# Patient Record
Sex: Male | Born: 1955 | Race: White | Hispanic: No | Marital: Married | Smoking: Never smoker
Health system: Southern US, Community
[De-identification: ages and names within clinical notes are randomized; demographics above are authoritative.]

## PROBLEM LIST (undated history)

## (undated) DIAGNOSIS — Z87442 Personal history of urinary calculi: Secondary | ICD-10-CM

## (undated) DIAGNOSIS — R350 Frequency of micturition: Secondary | ICD-10-CM

## (undated) DIAGNOSIS — G8929 Other chronic pain: Secondary | ICD-10-CM

## (undated) DIAGNOSIS — N400 Enlarged prostate without lower urinary tract symptoms: Secondary | ICD-10-CM

## (undated) DIAGNOSIS — N201 Calculus of ureter: Secondary | ICD-10-CM

## (undated) DIAGNOSIS — F32A Depression, unspecified: Secondary | ICD-10-CM

## (undated) DIAGNOSIS — R1310 Dysphagia, unspecified: Secondary | ICD-10-CM

## (undated) DIAGNOSIS — R51 Headache: Secondary | ICD-10-CM

## (undated) DIAGNOSIS — M542 Cervicalgia: Secondary | ICD-10-CM

## (undated) DIAGNOSIS — R3915 Urgency of urination: Secondary | ICD-10-CM

## (undated) DIAGNOSIS — R519 Headache, unspecified: Secondary | ICD-10-CM

## (undated) DIAGNOSIS — M5030 Other cervical disc degeneration, unspecified cervical region: Secondary | ICD-10-CM

## (undated) DIAGNOSIS — K219 Gastro-esophageal reflux disease without esophagitis: Secondary | ICD-10-CM

## (undated) DIAGNOSIS — R011 Cardiac murmur, unspecified: Secondary | ICD-10-CM

## (undated) DIAGNOSIS — F419 Anxiety disorder, unspecified: Secondary | ICD-10-CM

## (undated) DIAGNOSIS — F329 Major depressive disorder, single episode, unspecified: Secondary | ICD-10-CM

## (undated) DIAGNOSIS — Q263 Partial anomalous pulmonary venous connection: Secondary | ICD-10-CM

## (undated) DIAGNOSIS — Z8774 Personal history of (corrected) congenital malformations of heart and circulatory system: Secondary | ICD-10-CM

## (undated) DIAGNOSIS — E785 Hyperlipidemia, unspecified: Secondary | ICD-10-CM

## (undated) DIAGNOSIS — I447 Left bundle-branch block, unspecified: Secondary | ICD-10-CM

## (undated) DIAGNOSIS — I10 Essential (primary) hypertension: Secondary | ICD-10-CM

## (undated) DIAGNOSIS — M79606 Pain in leg, unspecified: Secondary | ICD-10-CM

## (undated) DIAGNOSIS — G459 Transient cerebral ischemic attack, unspecified: Secondary | ICD-10-CM

## (undated) HISTORY — PX: ASD REPAIR: SHX258

## (undated) HISTORY — PX: BACK SURGERY: SHX140

## (undated) HISTORY — PX: CERVICAL FUSION: SHX112

## (undated) HISTORY — PX: EXTRACORPOREAL SHOCK WAVE LITHOTRIPSY: SHX1557

## (undated) HISTORY — PX: OTHER SURGICAL HISTORY: SHX169

## (undated) HISTORY — PX: CORONARY ARTERY BYPASS GRAFT: SHX141

## (undated) HISTORY — PX: COLONOSCOPY: SHX174

---

## 1999-09-15 ENCOUNTER — Ambulatory Visit (HOSPITAL_COMMUNITY): Admission: RE | Admit: 1999-09-15 | Discharge: 1999-09-15 | Payer: Self-pay | Admitting: Urology

## 1999-09-15 ENCOUNTER — Encounter: Payer: Self-pay | Admitting: Urology

## 1999-09-30 ENCOUNTER — Encounter: Admission: RE | Admit: 1999-09-30 | Discharge: 1999-09-30 | Payer: Self-pay | Admitting: Urology

## 1999-12-28 ENCOUNTER — Emergency Department (HOSPITAL_COMMUNITY): Admission: EM | Admit: 1999-12-28 | Discharge: 1999-12-28 | Payer: Self-pay | Admitting: Emergency Medicine

## 1999-12-28 ENCOUNTER — Encounter: Payer: Self-pay | Admitting: Emergency Medicine

## 2000-06-08 ENCOUNTER — Encounter: Admission: RE | Admit: 2000-06-08 | Discharge: 2000-06-08 | Payer: Self-pay | Admitting: Urology

## 2000-06-08 ENCOUNTER — Encounter: Payer: Self-pay | Admitting: Urology

## 2000-06-09 ENCOUNTER — Encounter: Payer: Self-pay | Admitting: Urology

## 2000-06-09 ENCOUNTER — Encounter: Admission: RE | Admit: 2000-06-09 | Discharge: 2000-06-09 | Payer: Self-pay | Admitting: Urology

## 2000-06-21 ENCOUNTER — Ambulatory Visit (HOSPITAL_COMMUNITY): Admission: RE | Admit: 2000-06-21 | Discharge: 2000-06-21 | Payer: Self-pay | Admitting: Urology

## 2000-06-21 HISTORY — PX: OTHER SURGICAL HISTORY: SHX169

## 2000-10-11 ENCOUNTER — Encounter: Payer: Self-pay | Admitting: Emergency Medicine

## 2000-10-11 ENCOUNTER — Inpatient Hospital Stay (HOSPITAL_COMMUNITY): Admission: EM | Admit: 2000-10-11 | Discharge: 2000-10-12 | Payer: Self-pay | Admitting: Emergency Medicine

## 2001-05-23 ENCOUNTER — Encounter: Admission: RE | Admit: 2001-05-23 | Discharge: 2001-05-23 | Payer: Self-pay | Admitting: Urology

## 2001-05-23 ENCOUNTER — Ambulatory Visit (HOSPITAL_COMMUNITY): Admission: RE | Admit: 2001-05-23 | Discharge: 2001-05-23 | Payer: Self-pay | Admitting: Urology

## 2001-05-23 ENCOUNTER — Encounter: Payer: Self-pay | Admitting: Urology

## 2001-06-09 ENCOUNTER — Encounter: Payer: Self-pay | Admitting: Urology

## 2001-06-10 ENCOUNTER — Encounter: Admission: RE | Admit: 2001-06-10 | Discharge: 2001-06-10 | Payer: Self-pay

## 2001-06-13 ENCOUNTER — Ambulatory Visit (HOSPITAL_COMMUNITY): Admission: RE | Admit: 2001-06-13 | Discharge: 2001-06-13 | Payer: Self-pay | Admitting: Urology

## 2001-06-13 ENCOUNTER — Encounter: Payer: Self-pay | Admitting: Urology

## 2002-01-27 ENCOUNTER — Encounter: Admission: RE | Admit: 2002-01-27 | Discharge: 2002-01-27 | Payer: Self-pay | Admitting: Neurosurgery

## 2002-01-27 ENCOUNTER — Encounter: Payer: Self-pay | Admitting: Neurosurgery

## 2002-02-01 ENCOUNTER — Encounter: Admission: RE | Admit: 2002-02-01 | Discharge: 2002-02-01 | Payer: Self-pay | Admitting: Neurosurgery

## 2002-02-01 ENCOUNTER — Encounter: Payer: Self-pay | Admitting: Neurosurgery

## 2002-05-08 ENCOUNTER — Encounter: Payer: Self-pay | Admitting: Urology

## 2002-05-08 ENCOUNTER — Encounter: Admission: RE | Admit: 2002-05-08 | Discharge: 2002-05-08 | Payer: Self-pay | Admitting: Urology

## 2002-05-09 ENCOUNTER — Encounter: Payer: Self-pay | Admitting: Urology

## 2002-05-09 ENCOUNTER — Encounter: Admission: RE | Admit: 2002-05-09 | Discharge: 2002-05-09 | Payer: Self-pay | Admitting: Urology

## 2002-05-16 ENCOUNTER — Encounter: Payer: Self-pay | Admitting: Urology

## 2002-05-16 ENCOUNTER — Encounter: Admission: RE | Admit: 2002-05-16 | Discharge: 2002-05-16 | Payer: Self-pay | Admitting: Urology

## 2002-05-18 ENCOUNTER — Ambulatory Visit (HOSPITAL_BASED_OUTPATIENT_CLINIC_OR_DEPARTMENT_OTHER): Admission: RE | Admit: 2002-05-18 | Discharge: 2002-05-18 | Payer: Self-pay | Admitting: Urology

## 2002-05-18 ENCOUNTER — Encounter: Payer: Self-pay | Admitting: Urology

## 2002-06-12 ENCOUNTER — Encounter: Payer: Self-pay | Admitting: Urology

## 2002-06-12 ENCOUNTER — Encounter: Admission: RE | Admit: 2002-06-12 | Discharge: 2002-06-12 | Payer: Self-pay | Admitting: Urology

## 2002-10-16 ENCOUNTER — Encounter: Admission: RE | Admit: 2002-10-16 | Discharge: 2002-10-16 | Payer: Self-pay | Admitting: Urology

## 2002-10-16 ENCOUNTER — Encounter: Payer: Self-pay | Admitting: Urology

## 2002-10-18 ENCOUNTER — Encounter: Payer: Self-pay | Admitting: Urology

## 2002-10-18 ENCOUNTER — Encounter: Admission: RE | Admit: 2002-10-18 | Discharge: 2002-10-18 | Payer: Self-pay | Admitting: Urology

## 2003-02-09 ENCOUNTER — Encounter: Admission: RE | Admit: 2003-02-09 | Discharge: 2003-02-09 | Payer: Self-pay | Admitting: Family Medicine

## 2003-02-09 ENCOUNTER — Encounter: Payer: Self-pay | Admitting: Family Medicine

## 2004-06-17 ENCOUNTER — Ambulatory Visit (HOSPITAL_COMMUNITY): Admission: RE | Admit: 2004-06-17 | Discharge: 2004-06-17 | Payer: Self-pay | Admitting: Chiropractic Medicine

## 2004-08-18 ENCOUNTER — Ambulatory Visit (HOSPITAL_COMMUNITY): Admission: RE | Admit: 2004-08-18 | Discharge: 2004-08-18 | Payer: Self-pay | Admitting: Urology

## 2006-01-15 ENCOUNTER — Ambulatory Visit (HOSPITAL_BASED_OUTPATIENT_CLINIC_OR_DEPARTMENT_OTHER): Admission: RE | Admit: 2006-01-15 | Discharge: 2006-01-15 | Payer: Self-pay | Admitting: Urology

## 2006-01-15 HISTORY — PX: OTHER SURGICAL HISTORY: SHX169

## 2006-01-16 ENCOUNTER — Emergency Department (HOSPITAL_COMMUNITY): Admission: EM | Admit: 2006-01-16 | Discharge: 2006-01-16 | Payer: Self-pay | Admitting: Emergency Medicine

## 2006-01-18 ENCOUNTER — Ambulatory Visit (HOSPITAL_COMMUNITY): Admission: RE | Admit: 2006-01-18 | Discharge: 2006-01-18 | Payer: Self-pay | Admitting: Urology

## 2007-05-23 ENCOUNTER — Ambulatory Visit (HOSPITAL_COMMUNITY): Admission: RE | Admit: 2007-05-23 | Discharge: 2007-05-23 | Payer: Self-pay | Admitting: Urology

## 2009-10-10 ENCOUNTER — Ambulatory Visit (HOSPITAL_COMMUNITY): Admission: RE | Admit: 2009-10-10 | Discharge: 2009-10-10 | Payer: Self-pay | Admitting: Urology

## 2011-05-08 NOTE — Consult Note (Signed)
Scott Burch, Scott Burch                ACCOUNT NO.:  [id number]   MEDICAL RECORD NO.:  [id number]          PATIENT TYPE:  EMS   LOCATION:  ED                           FACILITY:  [facility]   PHYSICIAN:  Courtney Paris, M.D.DATE OF BIRTH:  [birth year]   DATE OF CONSULTATION:  01/16/2006  DATE OF DISCHARGE:                                   CONSULTATION   This 55 year old patient of Dr. Imelda Pillow came to the emergency room with  acute left flank pain that had become more chronic.  He had a stent placed  by Dr. Patsi Sears for a proximal UPJ stone on the left two days ago and  apparently has not been able to get comfortable since then.  He has had  stents before, but this was just causing excruciating pain and not being  relieved by his oxycodone that he had at home.   His vital signs are stable.  He is a middle-aged male in no acute distress  but seems to be pacing.  He did get comfortable with 100 mg of Demerol and  25 of Phenergan IM after about 30 minutes. Abd- mild CVAT left sid to fist  percussion.   KUB shows that the stent was in good position on the left side with about a  5-6 mm stone at the UP junction.  Bony structures and soft tissue shadows  look normal.  After going over this with him, spent 16 minutes with the  patient and his wife.  Gave him another prescription for some oxycodone  without acetaminophen because of his liver disease, #30 5 mg tablets to take  1-2 every 3-4 hours p.r.n. pain, a prescription for some Phenergan  suppositories 25 mg to take for nausea, no more than four per day.  He has  some Flomax and some Pyridium Plus to take at home.  He will limit his  activity and try to make it until January 29 when he has lithotripsy  scheduled.  He did not receive Toradol as originally ordered by Dr.  Patsi Sears, because that would have delayed his lithotripsy.      Courtney Paris, M.D.  Electronically Signed     HMK/MEDQ  D:  01/16/2006  T:   01/17/2006  Job:  [id number]

## 2011-05-08 NOTE — Op Note (Signed)
NAMELUCILLE, Scott Burch                ACCOUNT NO.:  [id number]   MEDICAL RECORD NO.:  [id number]          PATIENT TYPE:  AMB   LOCATION:  [facility]                         FACILITY:  [facility]   PHYSICIAN:  Sigmund I. Patsi Sears, M.D.DATE OF BIRTH:  1956-01-02   DATE OF PROCEDURE:  01/15/2006  DATE OF DISCHARGE:                                 OPERATIVE REPORT   PREOPERATIVE DIAGNOSIS:  Left upper ureteral calculus.   POSTOPERATIVE DIAGNOSIS:  Left upper ureteral calculus.   OPERATIONS:  Cystourethroscopy, left retrograde pyelogram, left  ureteroscopy, left double-J catheter (5-French x 26 cm [employer]).   SURGEON:  Sigmund I. Patsi Sears, M.D.   ANESTHESIA:  General LMA.   PREPARATION:  After appropriate preanesthesia, the patient is brought to the  operating room, placed on the operating table in dorsal supine position  where general LMA anesthesia was introduced. He was then replaced in dorsal  lithotomy position where the pubis was prepped with Betadine solution and  draped in the usual fashion.   REVIEW OF HISTORY:  This 55 year old male presented with acute and recurrent  renal colic, with left-sided pain, nausea, vomiting, with CT showing a 6 mm  in the left upper ureter. The stone has continued to cause colic and has not  progressed by KUB. Therefore, he will be for cystoscopy, double-J catheter  and possible stone manipulation..   DESCRIPTION OF PROCEDURE:  Cystourethroscopy was accomplished and showed a  normal-appearing bladder. Left retrograde pyelogram was performed and showed  upper ureteral dilation, secondary to a stone just outside the  ureterovesical junction. He appears to have and intrarenal pelvis.   The long 6.4 ureteroscope was passed into the upper ureter and into the  renal pelvis, but I could not identify the stone. It is presumed that the  stone was pushed into the kidney with x-ray contrast fluid.   It was elected to leave a double-J catheter, and a  5-French x 26 cm double-J  catheter was placed without difficulty coiled in the renal pelvis, and in  the bladder. The patient tolerated the procedure well. Xylocaine jelly was  placed in the ureter. Because of the patient's IODINE allergy, Hibiclens was  used to prep with. The patient was then given IV Toradol, awakened and taken  to the recovery room in good condition.      Sigmund I. Patsi Sears, M.D.  Electronically Signed     SIT/MEDQ  D:  01/15/2006  T:  01/16/2006  Job:  [id number]

## 2011-05-08 NOTE — Discharge Summary (Signed)
[facility]  Patient:    Scott Burch, Scott Burch                       MRN: [id number] Adm. Date:  [id number] Disc. Date: [id number] Attending:  Colon Branch CC:         Noralyn Pick. Eden Emms, M.D. [facility]  Dyanne Carrel, M.D.  Thomas A. Patty Sermons, M.D.   Discharge Summary  DATE OF BIRTH:  [birth year].  ADMISSION DIAGNOSES: 1. Chest pain, rule out myocardial infarction. 2. 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 3. Asthmatic bronchitis.  DISCHARGE DIAGNOSES: 1. Postpericardotomy syndrome. 2. Chest pain, resolved - myocardial infarction ruled out by enzymes. 3. Asthmatic bronchitis.  HISTORY OF PRESENT ILLNESS:  Please refer to Admission History and Physical for this patients initial presentation.  Briefly, he is a 55 year old gentleman who was admitted by [facility] with a history of chest pain and pressure.  He had had two weeks of intermittent symptoms, and the chest pain was radiating to his left arm.  He described the discomfort as tightness, and it was nonexertional and nonpositional.  There may have been a pleuritic component to it.  He had three episodes in the past two weeks.  On the morning of admission the episode was prolonged and led to his evaluation in the [facility] Emergency Department.  His EKG demonstrated no acute ischemic changes.  It was felt that his chest discomfort was unlikely to represent myocardial ischemia.  A ventilation-perfusion scan was ordered.  Pulmonary consultation was requested.  Upon my evaluation he was noted to be improved after nebulized bronchodilators.  He still was markedly wheezing on exam.  The chest pain had relented somewhat, but not completely resolved.  HOSPITAL COURSE:  His hospitalization was brief.  He was admitted to telemetry.  There were no arrhythmias noted.  Myocardial infarction was ruled out by enzymes.   The ventilation-perfusion scan revealed multiple matched perfusion and ventilation defects.  This was felt to be low probability for pulmonary embolism, and given my assessment that his pretest probability was extremely low, no further evaluation was warranted.  By the day after admission he was markedly improved and to be discharged.  DISCHARGE MEDICATIONS: 1. Celexa 20 mg p.o. q.d. 2. Androgel 50 mcg one patch q.d. 3. Colchicine 0.6 mg p.o. q.d. 4. Prednisone 40 mg p.o. q.d. x 5 days. 5. Advair 250/50 one puff q.12h. 6. Doxycycline 100 mg p.o. q.12h. x 7 days. 7. Albuterol two sprays q.4h. p.r.n. 8. Tussionex 5 cc p.o. q.12h. p.r.n. cough.  FOLLOWUP INSTRUCTIONS:  He is to call Dr. Sung Amabile office to arrange for a follow-up appointment in three to four weeks.  DISCHARGE INSTRUCTIONS:  He is to remain out of work for the remainder of this week, but can return to work on October 18, 2000, if he feels 90% improved or better.  ADDENDUM:  The patient was initially evaluated by [facility]; however, it was revealed that he has been seen by Dr. Patty Sermons in the past. Therefore, cardiology follow-up will be arranged with him. DD:  10/12/00 TD:  10/12/00 Job: [id number]

## 2011-05-08 NOTE — Op Note (Signed)
[facility]  Patient:    Scott Burch, Scott Burch                       MRN: [id number] Proc. Date: 06/21/00 Adm. Date:  [id number] Attending:  Laqueta Jean CC:         Sigmund I. Patsi Sears, M.D.                           Operative Report  PREOPERATIVE DIAGNOSIS:  Left ureteral stones.  POSTOPERATIVE DIAGNOSES:  Left ureteral stones, left ureteral stricture.  PROCEDURE:  Cystourethroscopy, left retrograde pyelogram, dilation of left lower ureteral stricture with left double J catheter, ureteroscopy.  SURGEON:  Sigmund I. Patsi Sears, M.D.  ANESTHESIA:  General endotracheal.  PREPARATION:  After appropriate preanesthesia, the patient was brought to the operating room and placed on the operating table in the dorsal supine position, where general endotracheal anesthesia was induced.  The patient was then replaced in the dorsal lithotomy position and the pubis was prepped with Betadine solution in the usual fashion.  DESCRIPTION OF PROCEDURE:  Cystourethroscopy was accomplished, which showed a normal-appearing bladder.  A left retrograde pyelogram was performed, which was felt to possibly show left ureteral calculi but definitely left renal pelvic stones, identified by CT scan.  These appeared to be quite small. Direct vision ureteroscopy revealed a dense left lower ureteral stricture just proximal to the ureterovesical junction, and this was dilated by passing a guidewire past the stricture and then dilating the stricture.  The ureteroscope then passed over a basket, which was passed through the scope into the upper ureter, further dilating the stricture.  Ureteroscopy did not reveal any stone in the ureter, and the ureter was ureteroscoped up to the level of the renal pelvis.  The basket was passed into the renal pelvis but could not capture any of these stones.  The ureteroscope was also withdrawn with the basket open in case any stones could be  identified on the way out, but no stones were identified.  It was felt that the patients problem primarily was secondary to the stricture disease of his ureter.  The double J catheter was passed into the renal pelvis and coiled in the bladder, and the patient was given IV Toradol, awakened, and taken to the recovery room in good condition. DD:  06/21/00 TD:  06/21/00 Job: [id number]

## 2012-05-31 ENCOUNTER — Encounter (HOSPITAL_COMMUNITY): Payer: Self-pay | Admitting: *Deleted

## 2012-05-31 ENCOUNTER — Observation Stay (HOSPITAL_COMMUNITY)
Admission: EM | Admit: 2012-05-31 | Discharge: 2012-06-01 | Disposition: A | Discharge status: Home / Self Care | Payer: 59 | Attending: Infectious Disease | Admitting: Infectious Disease

## 2012-05-31 ENCOUNTER — Emergency Department (HOSPITAL_COMMUNITY): Payer: 59

## 2012-05-31 DIAGNOSIS — E785 Hyperlipidemia, unspecified: Secondary | ICD-10-CM | POA: Insufficient documentation

## 2012-05-31 DIAGNOSIS — R42 Dizziness and giddiness: Secondary | ICD-10-CM | POA: Insufficient documentation

## 2012-05-31 DIAGNOSIS — R51 Headache: Principal | ICD-10-CM | POA: Insufficient documentation

## 2012-05-31 DIAGNOSIS — I10 Essential (primary) hypertension: Secondary | ICD-10-CM | POA: Insufficient documentation

## 2012-05-31 DIAGNOSIS — I639 Cerebral infarction, unspecified: Secondary | ICD-10-CM

## 2012-05-31 DIAGNOSIS — R5381 Other malaise: Secondary | ICD-10-CM | POA: Insufficient documentation

## 2012-05-31 HISTORY — DX: Hyperlipidemia, unspecified: E78.5

## 2012-05-31 HISTORY — DX: Essential (primary) hypertension: I10

## 2012-05-31 LAB — CSF CELL COUNT WITH DIFFERENTIAL
Eosinophils, CSF: NONE SEEN % (ref 0–1)
RBC Count, CSF: 112 /mm3 — ABNORMAL HIGH
RBC Count, CSF: 9 /mm3 — ABNORMAL HIGH
Segmented Neutrophils-CSF: NONE SEEN % (ref 0–6)
Tube #: 1
Tube #: 4
WBC, CSF: 1 /mm3 (ref 0–5)

## 2012-05-31 LAB — DIFFERENTIAL
Basophils Relative: 0 % (ref 0–1)
Eosinophils Absolute: 0.3 10*3/uL (ref 0.0–0.7)
Monocytes Absolute: 0.5 10*3/uL (ref 0.1–1.0)
Neutro Abs: 4.8 10*3/uL (ref 1.7–7.7)

## 2012-05-31 LAB — LIPID PANEL
LDL Cholesterol: 102 mg/dL — ABNORMAL HIGH (ref 0–99)
Total CHOL/HDL Ratio: 3.6 RATIO
VLDL: 25 mg/dL (ref 0–40)

## 2012-05-31 LAB — CBC
HCT: 41.6 % (ref 39.0–52.0)
MCV: 85.4 fL (ref 78.0–100.0)
Platelets: 197 10*3/uL (ref 150–400)
RBC: 4.87 MIL/uL (ref 4.22–5.81)
WBC: 7.4 10*3/uL (ref 4.0–10.5)

## 2012-05-31 LAB — CARDIAC PANEL(CRET KIN+CKTOT+MB+TROPI): Total CK: 213 U/L (ref 7–232)

## 2012-05-31 LAB — COMPREHENSIVE METABOLIC PANEL
ALT: 41 U/L (ref 0–53)
AST: 39 U/L — ABNORMAL HIGH (ref 0–37)
Albumin: 4.1 g/dL (ref 3.5–5.2)
CO2: 28 mEq/L (ref 19–32)
Calcium: 9.3 mg/dL (ref 8.4–10.5)
GFR calc non Af Amer: >90 mL/min (ref >90)
Glucose, Bld: 93 mg/dL (ref 70–99)
Sodium: 141 mEq/L (ref 135–145)
Total Protein: 7.3 g/dL (ref 6.0–8.3)

## 2012-05-31 LAB — GRAM STAIN

## 2012-05-31 LAB — PROTEIN AND GLUCOSE, CSF: Total  Protein, CSF: 25 mg/dL (ref 15–45)

## 2012-05-31 MED ORDER — MORPHINE SULFATE 4 MG/ML IJ SOLN: 4.0000 mg | INTRAMUSCULAR | Status: DC | PRN

## 2012-05-31 MED ORDER — ACETAMINOPHEN 325 MG PO TABS: 650.0000 mg | ORAL_TABLET | Freq: Four times a day (QID) | ORAL | Status: DC | PRN

## 2012-05-31 MED ORDER — ALUM & MAG HYDROXIDE-SIMETH 200-200-20 MG/5ML PO SUSP: 30.0000 mL | Freq: Four times a day (QID) | ORAL | Status: DC | PRN

## 2012-05-31 MED ORDER — ONDANSETRON HCL 4 MG/2ML IJ SOLN: 4.0000 mg | Freq: Four times a day (QID) | INTRAMUSCULAR | Status: DC | PRN

## 2012-05-31 MED ORDER — CITALOPRAM HYDROBROMIDE 40 MG PO TABS
40.0000 mg | ORAL_TABLET | Freq: Every day | ORAL | Status: DC
  Administered 2012-06-01: 40 mg via ORAL
  Filled 2012-05-31: qty 1

## 2012-05-31 MED ORDER — ASPIRIN 325 MG PO TABS
325.0000 mg | ORAL_TABLET | Freq: Every day | ORAL | Status: DC
  Administered 2012-05-31: 325 mg via ORAL
  Filled 2012-05-31 (×2): qty 1

## 2012-05-31 MED ORDER — ACETAMINOPHEN 650 MG RE SUPP: 650.0000 mg | Freq: Four times a day (QID) | RECTAL | Status: DC | PRN

## 2012-05-31 MED ORDER — SIMVASTATIN 20 MG PO TABS
20.0000 mg | ORAL_TABLET | Freq: Every day | ORAL | Status: DC
  Filled 2012-05-31: qty 1

## 2012-05-31 MED ORDER — LORAZEPAM 2 MG/ML IJ SOLN
1.0000 mg | Freq: Once | INTRAMUSCULAR | Status: AC
  Administered 2012-05-31: 1 mg via INTRAVENOUS
  Filled 2012-05-31: qty 1

## 2012-05-31 MED ORDER — SODIUM CHLORIDE 0.9 % IV SOLN
INTRAVENOUS | Status: AC
  Administered 2012-05-31: 23:00:00 via INTRAVENOUS

## 2012-05-31 MED ORDER — ONDANSETRON HCL 4 MG PO TABS: 4.0000 mg | ORAL_TABLET | Freq: Four times a day (QID) | ORAL | Status: DC | PRN

## 2012-05-31 NOTE — H&P (Signed)
Hospital Admission Note Date: 05/31/2012  Patient name: Scott Burch Medical record number: [id number] Date of birth: [birth year] Age: 56 y.o. Gender: male PCP: No primary provider on file. Doctor at [employer]  Medical Service: B 1 [facility]  Attending physician:   Dr. Daiva Eves  1st Contact:  Dr. Dorise Hiss  Pager: [phone] 2nd Contact:  Dr. Dorthula Rue  Pager: [phone] After 5 pm or weekends: 1st Contact:      Pager: [phone] 2nd Contact:      Pager: [phone]  Chief Complaint:  Headache, sleepiness  History of Present Illness: The patient is a 56 YO man who presents to the ED with acute onset of headache 3 days prior to admission that was the "worst headache of his life" which lasted for 45 minutes. He was drousy during this time and had some tingling in his left hand. He also had change in his gait with some stumbling. His wife feels he has limited memory of that evening. He went to bed that evening and the following day he was also drowsy the whole day with intermittent sleeping. He also had decreased appetite. On Monday, he tried to go to work and was unable to stay awake so went to [facility] ER with his wife. However the wait was extensive and they ended up leaving without being seen. They called his PCP the following day and were told to get evaluated and came to [facility] ED. They get all of their care at [facility] because he had a congenital heart surgery and they feel they will have their records. They have a PCP there. His only past medical history is significant for hyperlipidemia and hypertension. No fevers at home.   Meds: Current Outpatient Rx  Name Route Sig Dispense Refill  . ASPIRIN 81 MG PO CHEW Oral Chew 81-162 mg by mouth at bedtime.    Marland Kitchen CITALOPRAM HYDROBROMIDE 40 MG PO TABS Oral Take 40 mg by mouth daily.    Marland Kitchen LISINOPRIL 20 MG PO TABS Oral Take 20 mg by mouth daily.    Marland Kitchen OVER THE COUNTER MEDICATION Oral Take 1 tablet by mouth daily as needed. urocit For kidney stones    . SIMVASTATIN 20 MG PO  TABS Oral Take 20 mg by mouth daily.      Allergies: Allergies as of 05/31/2012 - Review Complete 05/31/2012  Allergen Reaction Noted  . Iodine Anaphylaxis and Hives 05/31/2012   Past Medical History  Diagnosis Date  . Heart disease, unspecified   . Hypertension   . Hyperlipidemia    Past Surgical History  Procedure Date  . Cervical fusion   . Kidney stone surgery   . Cardiac surgery    No family history on file. History   Social History  . Marital Status: Married    Spouse Name: N/A    Number of Children: N/A  . Years of Education: N/A   Occupational History  . Not on file.   Social History Main Topics  . Smoking status: Never Smoker   . Smokeless tobacco: Not on file  . Alcohol Use: Yes  . Drug Use: No  . Sexually Active:    Other Topics Concern  . Not on file   Social History Narrative  . No narrative on file    Review of Systems: Pertinent items are noted in HPI.  Physical Exam: Blood pressure 139/73, pulse 61, temperature 98.4 F (36.9 C), temperature source Oral, resp. rate 18, height 5\' 9"  (1.753 m), weight 192 lb (87.091 kg),  SpO2 98.00%.   Lab results: Basic Metabolic Panel:  Basename 05/31/12 1423  NA 141  K 4.4  CL 104  CO2 28  GLUCOSE 93  BUN 15  CREATININE 0.89  CALCIUM 9.3  MG --  PHOS --   Liver Function Tests:  Basename 05/31/12 1423  AST 39*  ALT 41  ALKPHOS 80  BILITOT 0.3  PROT 7.3  ALBUMIN 4.1   CBC:  Basename 05/31/12 1423  WBC 7.4  NEUTROABS 4.8  HGB 13.7  HCT 41.6  MCV 85.4  PLT 197   Imaging results:  Dg Chest 2 View  05/31/2012  *RADIOLOGY REPORT*  Clinical Data: Weakness.  Headache and numbness.  CHEST - 2 VIEW  Comparison: 05/31/2012  Findings: There is mild cardiac enlargement.  There is no pleural effusion or edema.  There is no airspace consolidation identified.  Prior median sternotomy and CABG procedure.  IMPRESSION:  1.  No acute cardiopulmonary abnormalities.  Original Report Authenticated By:  Rosealee Albee, M.D.   Ct Head Wo Contrast  05/31/2012  *RADIOLOGY REPORT*  Clinical Data: Dizziness CT, and weakness.  CT HEAD WITHOUT CONTRAST  Technique:  Contiguous axial images were obtained from the base of the skull through the vertex without contrast.  Comparison: None  Findings: No acute intracranial abnormality is identified. Specifically, there is no hemorrhage, hydrocephalus, mass effect, mass lesion, or evidence of acute infarction.  There is some mucosal thickening of the ethmoid air cells.  There are no air-fluid levels in the sinuses.  The skull is intact.  The soft tissues of the scalp are symmetric.  IMPRESSION:  1.  No acute intracranial abnormality. 2.  Chronic ethmoid sinus disease.  Original Report Authenticated By: Britta Mccreedy, M.D.    Other results: EKG: unchanged from previous tracings, widened QRS unchanged from 2010, no signs of ischemia or t wave changes.  Assessment & Plan by Problem: TIA/Stroke Headache- Will likely need MRI head and will order FLP, HgA1C, HIV, UDS, CE. To rule out intracranial bleed will await results of LP for cell count and gram stain. If negative for bleed will likely also need carotid dopplers and 2 D echo. Other differential could include migraine.   Hypertension - Will hold lisinopril as he may have stroke and allow permissive hypertension.   Hyperlipidemia - Pt will be continued on statin and based on FLP may need dosage adjustment.   History of congenital heart surgery - No details about this however was done at [facility] years ago.   DVT ppx - none due to concern for head bleed  Signed: Genella Mech 05/31/2012, 6:13 PM

## 2012-05-31 NOTE — ED Notes (Signed)
Patient reports onset of worst headache ever on Saturday.  He slept for 14 hours.  He has noticed since he has unsteady gait, dizziness, sleepiness, and feels weak on his left side.  He continues to have a mild headache

## 2012-05-31 NOTE — ED Notes (Signed)
Patient transported to CT 

## 2012-05-31 NOTE — CV Procedure (Signed)
Lumbar punctureLumbar Puncture Procedure Note  Pre-operative Diagnosis: Headache  Post-operative Diagnosis:  headache  Indications: Diagnostic  Procedure Details   Consent: Informed consent was obtained. Risks of the procedure were discussed including: infection, bleeding, pain and headache.  The patient was positioned under sterile conditions. Betadine solution and sterile drapes were utilized. A spinal needle was inserted at the L3/4 interspace.  Spinal fluid was obtained and sent to the laboratory.  Findings 10 mL of clear spinal fluid was obtained. Opening Pressure: 16 cm H2O pressure. Closing Pressure: n/a Complications: None; patient tolerated the procedure well.  Condition: stable Plan Bed rest for 4 hours. Tylenol 650 mg for pain. Call office if you develop a severe headache, nausea, vomiting, or fever greater than 100.5 F.

## 2012-05-31 NOTE — ED Notes (Signed)
MD at bedside. Hospitalist

## 2012-05-31 NOTE — ED Notes (Signed)
MD at bedside. 

## 2012-05-31 NOTE — ED Provider Notes (Signed)
History     CSN: [id number]  Arrival date & time 05/31/12  1213   First MD Initiated Contact with Patient 05/31/12 1513      Chief Complaint  Patient presents with  . Dizziness  . Fatigue  . Weakness    (Consider location/radiation/quality/duration/timing/severity/associated sxs/prior treatment) Patient is a 56 y.o. male presenting with weakness. The history is provided by the patient.  Weakness The primary symptoms include headaches, dizziness and focal weakness. Primary symptoms do not include seizures, nausea or vomiting.  The headache is associated with weakness. The headache is not associated with eye pain or neck stiffness.  Dizziness also occurs with weakness. Dizziness does not occur with nausea or vomiting.   Additional symptoms include weakness. Additional symptoms do not include neck stiffness.   patient developed acute onset of a severe headache on Saturday. He states it is throbbing in front of his left eye. He states he was sleepy and slept 14 hours after her period he states he's had some unsteadiness since then. He states he's been sleeping more. He continues to have a headache. No trauma. He has not had symptoms like this before. The headache is constant. No chest pain. No abdominal pain. No fevers. He states his left arm also hurts him. He is right-handed. He states he's been sleeping a lot the last few days.  Past Medical History  Diagnosis Date  . Heart disease, unspecified   . Hypertension   . Hyperlipidemia     Past Surgical History  Procedure Date  . Cervical fusion   . Kidney stone surgery   . Cardiac surgery     No family history on file.  History  Substance Use Topics  . Smoking status: Never Smoker   . Smokeless tobacco: Not on file  . Alcohol Use: Yes      Review of Systems  Constitutional: Positive for fatigue. Negative for activity change and appetite change.  HENT: Negative for neck stiffness.   Eyes: Negative for pain.    Respiratory: Negative for chest tightness and shortness of breath.   Cardiovascular: Negative for chest pain and leg swelling.  Gastrointestinal: Negative for nausea, vomiting, abdominal pain and diarrhea.  Genitourinary: Negative for flank pain.  Musculoskeletal: Negative for back pain.  Skin: Negative for rash.  Neurological: Positive for dizziness, focal weakness, weakness, numbness and headaches. Negative for seizures and speech difficulty.  Psychiatric/Behavioral: Negative for behavioral problems.    Allergies  Iodine  Home Medications   No current outpatient prescriptions on file.  BP 116/57  Pulse 67  Temp(Src) 98.1 F (36.7 C) (Oral)  Resp 20  Ht 5\' 9"  (1.753 m)  Wt 192 lb (87.091 kg)  BMI 28.35 kg/m2  SpO2 97%  Physical Exam  Nursing note and vitals reviewed. Constitutional: He is oriented to person, place, and time. He appears well-developed and well-nourished.  HENT:  Head: Normocephalic and atraumatic.  Eyes: EOM are normal. Pupils are equal, round, and reactive to light.  Neck: Normal range of motion. Neck supple.  Cardiovascular: Normal rate, regular rhythm and normal heart sounds.   No murmur heard. Pulmonary/Chest: Effort normal and breath sounds normal.  Abdominal: Soft. Bowel sounds are normal. He exhibits no distension and no mass. There is no tenderness. There is no rebound and no guarding.  Musculoskeletal: Normal range of motion. He exhibits no edema.  Neurological: He is alert and oriented to person, place, and time. No cranial nerve deficit. Coordination abnormal.       Finger-nose  is off on left side. Heel shin appears intact. Patient able to cannulate 2 steps without difficulty forward backwards. There is no Romberg. No pronator drift. Extraocular movements are intact. Grip strength is good bilaterally. Flexion and extension at wrist and elbows  Skin: Skin is warm and dry.  Psychiatric: He has a normal mood and affect.    ED Course  Procedures  (including critical care time)  Labs Reviewed  COMPREHENSIVE METABOLIC PANEL - Abnormal; Notable for the following:    AST 39 (*)    All other components within normal limits  CSF CELL COUNT WITH DIFFERENTIAL - Abnormal; Notable for the following:    RBC Count, CSF 112 (*)    All other components within normal limits  CSF CELL COUNT WITH DIFFERENTIAL - Abnormal; Notable for the following:    RBC Count, CSF 9 (*)    All other components within normal limits  LIPID PANEL - Abnormal; Notable for the following:    LDL Cholesterol 102 (*)    All other components within normal limits  CBC  DIFFERENTIAL  GRAM STAIN  PROTEIN AND GLUCOSE, CSF  CARDIAC PANEL(CRET KIN+CKTOT+MB+TROPI)  CSF CULTURE  GLUCOSE, CSF  PROTEIN, CSF  CBC  COMPREHENSIVE METABOLIC PANEL  URINE RAPID DRUG SCREEN (HOSP PERFORMED)  HIV ANTIBODY (ROUTINE TESTING)  HEMOGLOBIN A1C  CARDIAC PANEL(CRET KIN+CKTOT+MB+TROPI)   Dg Chest 2 View  05/31/2012  *RADIOLOGY REPORT*  Clinical Data: Weakness.  Headache and numbness.  CHEST - 2 VIEW  Comparison: 05/31/2012  Findings: There is mild cardiac enlargement.  There is no pleural effusion or edema.  There is no airspace consolidation identified.  Prior median sternotomy and CABG procedure.  IMPRESSION:  1.  No acute cardiopulmonary abnormalities.  Original Report Authenticated By: Rosealee Albee, M.D.   Ct Head Wo Contrast  05/31/2012  *RADIOLOGY REPORT*  Clinical Data: Dizziness CT, and weakness.  CT HEAD WITHOUT CONTRAST  Technique:  Contiguous axial images were obtained from the base of the skull through the vertex without contrast.  Comparison: None  Findings: No acute intracranial abnormality is identified. Specifically, there is no hemorrhage, hydrocephalus, mass effect, mass lesion, or evidence of acute infarction.  There is some mucosal thickening of the ethmoid air cells.  There are no air-fluid levels in the sinuses.  The skull is intact.  The soft tissues of the scalp  are symmetric.  IMPRESSION:  1.  No acute intracranial abnormality. 2.  Chronic ethmoid sinus disease.  Original Report Authenticated By: Britta Mccreedy, M.D.   Dg Lumbar Puncture Fluoro Guide  05/31/2012  *RADIOLOGY REPORT*  Clinical Data:  Headaches and left arm numbness  DIAGNOSTIC LUMBAR PUNCTURE UNDER FLUOROSCOPIC GUIDANCE  Fluoroscopy time:  57 seconds .  Technique:  Informed consent was obtained from the patient prior to the procedure, including potential complications of headache, allergy, and pain.   With the patient prone, the lower back was prepped with Betadine.  1% Lidocaine was used for local anesthesia. Lumbar puncture was performed at the L3-4 level using a 22 gauge needle with return of clear CSF with an opening pressure of 16 cm water.   12 ml of CSF were obtained for laboratory studies.  The patient tolerated the procedure well and there were no apparent complications.  IMPRESSION: Technically successful lumbar puncture without complications.  Original Report Authenticated By: Rosealee Albee, M.D.     1. Headache   2. Stroke       MDM  Patient with worsening headache of his  life that began on Saturday. He states his been sleeping a lot since then including 14 hours straight he states he is had some unsteadiness since and feels weak on his left side. He has focal findings of difficulty with coordination in his left upper extremity. Initial CT scan was negative. Patient had a lumbar puncture done by interventional radiology. Patient be admitted to medicine for stroke workup.        Juliet Rude. Rubin Payor, MD 05/31/12 2357

## 2012-06-01 ENCOUNTER — Inpatient Hospital Stay (HOSPITAL_COMMUNITY): Payer: 59

## 2012-06-01 LAB — HEMOGLOBIN A1C
Hgb A1c MFr Bld: 5.8 % — ABNORMAL HIGH (ref <5.7)
Mean Plasma Glucose: 120 mg/dL — ABNORMAL HIGH (ref <117)

## 2012-06-01 MED ORDER — ASPIRIN EC 81 MG PO TBEC
81.0000 mg | DELAYED_RELEASE_TABLET | Freq: Every day | ORAL | Status: DC
  Administered 2012-06-01: 81 mg via ORAL
  Filled 2012-06-01: qty 1

## 2012-06-01 MED ORDER — HEPARIN SODIUM (PORCINE) 5000 UNIT/ML IJ SOLN
5000.0000 [IU] | Freq: Three times a day (TID) | INTRAMUSCULAR | Status: DC
  Filled 2012-06-01 (×3): qty 1

## 2012-06-01 NOTE — Discharge Instructions (Signed)
STROKE/TIA DISCHARGE INSTRUCTIONS SMOKING Cigarette smoking nearly doubles your risk of having a stroke & is the single most alterable risk factor  If you smoke or have smoked in the last 12 months, you are advised to quit smoking for your health.  Most of the excess cardiovascular risk related to smoking disappears within a year of stopping.  Ask you doctor about anti-smoking medications  [place] Quit Line: 1-800-QUIT NOW  Free Smoking Cessation Classes [phone]  CHOLESTEROL Know your levels; limit fat & cholesterol in your diet  Lipid Panel     Component Value Date/Time   CHOL 176 05/31/2012 2116   TRIG 127 05/31/2012 2116   HDL 49 05/31/2012 2116   CHOLHDL 3.6 05/31/2012 2116   VLDL 25 05/31/2012 2116   LDLCALC 102* 05/31/2012 2116      Many patients benefit from treatment even if their cholesterol is at goal.  Goal: Total Cholesterol (CHOL) less than 160  Goal:  Triglycerides (TRIG) less than 150  Goal:  HDL greater than 40  Goal:  LDL (LDLCALC) less than 100   BLOOD PRESSURE American Stroke Association blood pressure target is less that 120/80 mm/Hg  Your discharge blood pressure is:  BP: 119/57 mmHg  Monitor your blood pressure  Limit your salt and alcohol intake  Many individuals will require more than one medication for high blood pressure  DIABETES (A1c is a blood sugar average for last 3 months) Goal HGBA1c is under 7% (HBGA1c is blood sugar average for last 3 months)  Diabetes: {STROKE DC DIABETES:22357}    Lab Results  Component Value Date   HGBA1C 5.8* 05/31/2012     Your HGBA1c can be lowered with medications, healthy diet, and exercise.  Check your blood sugar as directed by your physician  Call your physician if you experience unexplained or low blood sugars.  PHYSICAL ACTIVITY/REHABILITATION Goal is 30 minutes at least 4 days per week    {STROKE DC ACTIVITY/REHAB:22359}  Activity decreases your risk of heart attack and stroke and makes your heart  stronger.  It helps control your weight and blood pressure; helps you relax and can improve your mood.  Participate in a regular exercise program.  Talk with your doctor about the best form of exercise for you (dancing, walking, swimming, cycling).  DIET/WEIGHT Goal is to maintain a healthy weight  Your discharge diet is: General *** liquids Your height is:  Height: 5\' 9"  (175.3 cm) Your current weight is: Weight: 87.091 kg (192 lb) Your Body Mass Index (BMI) is:  BMI (Calculated): 28.4   Following the type of diet specifically designed for you will help prevent another stroke.  Your goal weight range is:  ***  Your goal Body Mass Index (BMI) is 19-24.  Healthy food habits can help reduce 3 risk factors for stroke:  High cholesterol, hypertension, and excess weight.  RESOURCES Stroke/Support Group:  Call [phone]  they meet the 3rd Sunday of the month on the Rehab Unit at [facility], [place] ( no meetings June, July & Aug).  STROKE EDUCATION PROVIDED/REVIEWED AND GIVEN TO PATIENT Stroke warning signs and symptoms How to activate emergency medical system (call 911). Medications prescribed at discharge. Need for follow-up after discharge. Personal risk factors for stroke. Pneumonia vaccine given:   {STROKE DC YES/NO/DATE:22363} Flu vaccine given:   {STROKE DC YES/NO/DATE:22363} My questions have been answered, the writing is legible, and I understand these instructions.  I will adhere to these goals & educational materials that have been provided  to me after my discharge from the hospital.

## 2012-06-01 NOTE — Discharge Summary (Signed)
Internal Medicine Teaching [facility] Discharge Note  Name: Scott Burch MRN: [id number] DOB: 1956/11/17 56 y.o.  Date of Admission: 05/31/2012 12:23 PM Date of Discharge: 06/01/2012 Attending Physician: Randall Hiss, MD  Discharge Diagnosis: Hypertension Hyperlipidemia Headache versus TIA  Discharge Medications: Medication List  As of 06/01/2012 11:47 AM   ASK your doctor about these medications         aspirin 81 MG chewable tablet   Chew 81-162 mg by mouth at bedtime.      citalopram 40 MG tablet   Commonly known as: CELEXA   Take 40 mg by mouth daily.      lisinopril 20 MG tablet   Commonly known as: PRINIVIL,ZESTRIL   Take 20 mg by mouth daily.      OVER THE COUNTER MEDICATION   Take 1 tablet by mouth daily as needed. urocit  For kidney stones      simvastatin 20 MG tablet   Commonly known as: ZOCOR   Take 20 mg by mouth daily.            Disposition and follow-up:   Scott Burch was discharged from [facility] in Stable condition.  At the hospital follow up visit please address headaches and if they have recurred. If no recent carotid doppler may consider ordering that.   Follow-up Appointments: Please follow up with PCP Veneda Melter at [facility].    Consultations:   None  Procedures Performed:  Dg Chest 2 View  05/31/2012  *RADIOLOGY REPORT*  Clinical Data: Weakness.  Headache and numbness.  CHEST - 2 VIEW  Comparison: 05/31/2012  Findings: There is mild cardiac enlargement.  There is no pleural effusion or edema.  There is no airspace consolidation identified.  Prior median sternotomy and CABG procedure.  IMPRESSION:  1.  No acute cardiopulmonary abnormalities.  Original Report Authenticated By: Rosealee Albee, M.D.   Ct Head Wo Contrast  05/31/2012  *RADIOLOGY REPORT*  Clinical Data: Dizziness CT, and weakness.  CT HEAD WITHOUT CONTRAST  Technique:  Contiguous axial images were obtained from the base of the skull  through the vertex without contrast.  Comparison: None  Findings: No acute intracranial abnormality is identified. Specifically, there is no hemorrhage, hydrocephalus, mass effect, mass lesion, or evidence of acute infarction.  There is some mucosal thickening of the ethmoid air cells.  There are no air-fluid levels in the sinuses.  The skull is intact.  The soft tissues of the scalp are symmetric.  IMPRESSION:  1.  No acute intracranial abnormality. 2.  Chronic ethmoid sinus disease.  Original Report Authenticated By: Britta Mccreedy, M.D.   Mr Brain Wo Contrast  06/01/2012  *RADIOLOGY REPORT*  Clinical Data: Worst headache of life  MRI HEAD WITHOUT CONTRAST  Technique:  Multiplanar, multiecho pulse sequences of the brain and surrounding structures were obtained according to standard protocol without intravenous contrast.  Comparison: CT head 05/31/2012  Findings: Ventricle size is normal.  Negative for acute or chronic infarct.  Cerebral white matter is normal.  Brainstem and cerebellum are normal.  Negative for hemorrhage or mass lesion.  Vessels at the base of the brain are patent.  Chronic sinusitis with mucosal thickening in the paranasal sinuses. No air-fluid level.  IMPRESSION: No significant intracranial abnormality.  Sinusitis  Original Report Authenticated By: Camelia Phenes, M.D.   Dg Lumbar Puncture Fluoro Guide  05/31/2012  *RADIOLOGY REPORT*  Clinical Data:  Headaches and left arm numbness  DIAGNOSTIC LUMBAR PUNCTURE  UNDER FLUOROSCOPIC GUIDANCE  Fluoroscopy time:  57 seconds .  Technique:  Informed consent was obtained from the patient prior to the procedure, including potential complications of headache, allergy, and pain.   With the patient prone, the lower back was prepped with Betadine.  1% Lidocaine was used for local anesthesia. Lumbar puncture was performed at the L3-4 level using a 22 gauge needle with return of clear CSF with an opening pressure of 16 cm water.   12 ml of CSF were obtained  for laboratory studies.  The patient tolerated the procedure well and there were no apparent complications.  IMPRESSION: Technically successful lumbar puncture without complications.  Original Report Authenticated By: Rosealee Albee, M.D.   Admission HPI:  The patient is a 56 YO man who presents to the ED with acute onset of headache 3 days prior to admission that was the "worst headache of his life" which lasted for 45 minutes. He was drousy during this time and had some tingling in his left hand. He also had change in his gait with some stumbling. His wife feels he has limited memory of that evening. He went to bed that evening and the following day he was also drowsy the whole day with intermittent sleeping. He also had decreased appetite. On Monday, he tried to go to work and was unable to stay awake so went to [facility] with his wife. However the wait was extensive and they ended up leaving without being seen. They called his PCP the following day and were told to get evaluated and came to [facility] ED. They get all of their care at [facility] because he had a congenital heart surgery and they feel they will have their records. They have a PCP there. His only past medical history is significant for hyperlipidemia and hypertension. No fevers at home.  Hospital Course by problem list:  Headache versus TIA - Likely to be migraine versus TIA. Did advise patient to continue to take aspirin daily and control his cholesterol and blood pressure. HgA1C 5.8 and advised patient to lose about 5-10 pounds to help keep him from developing diabetes. Advised patient to keep a headache diary if he has recurrence of the headache. LP was negative was bleed and CT and MRI were negative for stroke. Marland Kitchen  Hypertension - ACE inhibitor was held on admission based on possible stroke but was resumed at discharge. Blood pressure was controlled in the 120s/60s during this admission.   Hyperlipidemia - LDL was 102 during this admission  and will allow PCP to determine if increase in dosing of simvastatin is desired. Very close to goal of <100.  History of heart surgery as child - Pt did have stress test last in December which was normal and Echo with bubble study that was normal. Has gotten carotid doppler every other year per the patient and did not repeat during this admission.   Discharge Vitals:  BP 120/66  Pulse 65  Temp 97.5 F (36.4 C) (Oral)  Resp 20  Ht 5\' 9"  (1.753 m)  Wt 192 lb (87.091 kg)  BMI 28.35 kg/m2  SpO2 100%  Discharge Labs:  Results for orders placed during the hospital encounter of 05/31/12 (from the past 24 hour(s))  CBC     Status: Normal   Collection Time   05/31/12  2:23 PM      Component Value Range   WBC 7.4  4.0 - 10.5 K/uL   RBC 4.87  4.22 - 5.81 MIL/uL  Hemoglobin 13.7  13.0 - 17.0 g/dL   HCT 16.1  09.6 - 04.5 %   MCV 85.4  78.0 - 100.0 fL   MCH 28.1  26.0 - 34.0 pg   MCHC 32.9  30.0 - 36.0 g/dL   RDW 40.9  81.1 - 91.4 %   Platelets 197  150 - 400 K/uL  DIFFERENTIAL     Status: Normal   Collection Time   05/31/12  2:23 PM      Component Value Range   Neutrophils Relative 64  43 - 77 %   Neutro Abs 4.8  1.7 - 7.7 K/uL   Lymphocytes Relative 25  12 - 46 %   Lymphs Abs 1.8  0.7 - 4.0 K/uL   Monocytes Relative 6  3 - 12 %   Monocytes Absolute 0.5  0.1 - 1.0 K/uL   Eosinophils Relative 5  0 - 5 %   Eosinophils Absolute 0.3  0.0 - 0.7 K/uL   Basophils Relative 0  0 - 1 %   Basophils Absolute 0.0  0.0 - 0.1 K/uL  COMPREHENSIVE METABOLIC PANEL     Status: Abnormal   Collection Time   05/31/12  2:23 PM      Component Value Range   Sodium 141  135 - 145 mEq/L   Potassium 4.4  3.5 - 5.1 mEq/L   Chloride 104  96 - 112 mEq/L   CO2 28  19 - 32 mEq/L   Glucose, Bld 93  70 - 99 mg/dL   BUN 15  6 - 23 mg/dL   Creatinine, Ser 7.82  0.50 - 1.35 mg/dL   Calcium 9.3  8.4 - 95.6 mg/dL   Total Protein 7.3  6.0 - 8.3 g/dL   Albumin 4.1  3.5 - 5.2 g/dL   AST 39 (*) 0 - 37 U/L   ALT 41  0  - 53 U/L   Alkaline Phosphatase 80  39 - 117 U/L   Total Bilirubin 0.3  0.3 - 1.2 mg/dL   GFR calc non Af Amer >90  >90 mL/min   GFR calc Af Amer >90  >90 mL/min  CSF CELL COUNT WITH DIFFERENTIAL     Status: Abnormal   Collection Time   05/31/12  5:54 PM      Component Value Range   Tube # 1     Color, CSF COLORLESS  COLORLESS   Appearance, CSF CLEAR  CLEAR   Supernatant NOT INDICATED     RBC Count, CSF 112 (*) 0 /cu mm   WBC, CSF 1  0 - 5 /cu mm   Segmented Neutrophils-CSF NONE SEEN  0 - 6 %   Lymphs, CSF FEW  40 - 80 %   Monocyte-Macrophage-Spinal Fluid RARE  15 - 45 %   Eosinophils, CSF NONE SEEN  0 - 1 %  CSF CULTURE     Status: Normal (Preliminary result)   Collection Time   05/31/12  5:54 PM      Component Value Range   Specimen Description CSF     Special Requests TUBE 2@2 .2CC     Gram Stain       Value: CYTOSPIN WBC PRESENT, PREDOMINANTLY MONONUCLEAR     NO ORGANISMS SEEN     Performed at [facility]   Culture PENDING     Report Status PENDING    GRAM STAIN     Status: Normal   Collection Time   05/31/12  5:54 PM  Component Value Range   Specimen Description CSF     Special Requests TUBE 2@2 .2CC     Gram Stain       Value: CYTOSPIN SLIDE     WBC PRESENT, PREDOMINANTLY MONONUCLEAR     NO ORGANISMS SEEN   Report Status 05/31/2012 FINAL    PROTEIN AND GLUCOSE, CSF     Status: Normal   Collection Time   05/31/12  5:54 PM      Component Value Range   Glucose, CSF 54  43 - 76 mg/dL   Total  Protein, CSF 25  15 - 45 mg/dL  CSF CELL COUNT WITH DIFFERENTIAL     Status: Abnormal   Collection Time   05/31/12  5:54 PM      Component Value Range   Tube # 4     Color, CSF COLORLESS  COLORLESS   Appearance, CSF CLEAR  CLEAR   Supernatant NOT INDICATED     RBC Count, CSF 9 (*) 0 /cu mm   WBC, CSF 1  0 - 5 /cu mm   Segmented Neutrophils-CSF NONE SEEN  0 - 6 %   Lymphs, CSF FEW  40 - 80 %   Monocyte-Macrophage-Spinal Fluid RARE  15 - 45 %   Eosinophils, CSF  NONE SEEN  0 - 1 %  CARDIAC PANEL(CRET KIN+CKTOT+MB+TROPI)     Status: Normal   Collection Time   05/31/12  9:13 PM      Component Value Range   Total CK 213  7 - 232 U/L   CK, MB 3.2  0.3 - 4.0 ng/mL   Troponin I <0.30  <0.30 ng/mL   Relative Index 1.5  0.0 - 2.5  LIPID PANEL     Status: Abnormal   Collection Time   05/31/12  9:16 PM      Component Value Range   Cholesterol 176  0 - 200 mg/dL   Triglycerides 161  <096 mg/dL   HDL 49  >04 mg/dL   Total CHOL/HDL Ratio 3.6     VLDL 25  0 - 40 mg/dL   LDL Cholesterol 540 (*) 0 - 99 mg/dL  HIV ANTIBODY (ROUTINE TESTING)     Status: Normal   Collection Time   05/31/12  9:24 PM      Component Value Range   HIV NON REACTIVE  NON REACTIVE  HEMOGLOBIN A1C     Status: Abnormal   Collection Time   05/31/12  9:24 PM      Component Value Range   Hemoglobin A1C 5.8 (*) <5.7 %   Mean Plasma Glucose 120 (*) <117 mg/dL    Signed: Genella Mech 06/01/2012, 11:47 AM   Time Spent on Discharge: 20 minutes

## 2012-06-01 NOTE — Progress Notes (Signed)
PT and wife provided with d/c instructions. NO medication started on the hospitalization. Pt informed when next meds are due. Pt negative for a stroke so NIH not completed at d/c. Stroke risk factor education given and s/s of stroke education given. IV removed with tip intact. Heart monitor cleaned and returned to front. Pt appears stable for d/c. Pt leaving floor by foot with wife for home. Ramond Craver, RN

## 2012-06-01 NOTE — Progress Notes (Signed)
Utilization review completed.  

## 2012-06-01 NOTE — Progress Notes (Signed)
Subjective: No problems overnight, some mild headache from lying still for MRI head. No fevers, chills overnight. No recurrence of tingling. Answered all the questions of his wife and himself about long term things they can do. Did counsel about losing some weight and continuing to take care of his medical problems for long term. No chest pain or SOB overnight.   Objective: Vital signs in last 24 hours: Filed Vitals:   06/01/12 0100 06/01/12 0300 06/01/12 0500 06/01/12 0800  BP: 87/42 95/46 119/57 120/66  Pulse: 64 58 60 65  Temp: 97.5 F (36.4 C) 97.3 F (36.3 C) 97.6 F (36.4 C) 97.5 F (36.4 C)  TempSrc: Oral Oral Oral Oral  Resp: 18 20 20 20   Height:      Weight:      SpO2: 98% 99% 100% 100%   Weight change:  No intake or output data in the 24 hours ending 06/01/12 1141 General: resting in bed HEENT: PERRL, EOMI, no scleral icterus Cardiac: S1 S2 heard Pulm: clear to auscultation bilaterally, moving normal volumes of air Abd: soft, nontender, nondistended, BS present Ext: warm and well perfused, no pedal edema Neuro: alert and oriented X3, cranial nerves II-XII grossly intact  Lab Results: Basic Metabolic Panel:  Lab 05/31/12 2956  NA 141  K 4.4  CL 104  CO2 28  GLUCOSE 93  BUN 15  CREATININE 0.89  CALCIUM 9.3  MG --  PHOS --   Liver Function Tests:  Lab 05/31/12 1423  AST 39*  ALT 41  ALKPHOS 80  BILITOT 0.3  PROT 7.3  ALBUMIN 4.1  CBC:  Lab 05/31/12 1423  WBC 7.4  NEUTROABS 4.8  HGB 13.7  HCT 41.6  MCV 85.4  PLT 197   Cardiac Enzymes:  Lab 05/31/12 2113  CKTOTAL 213  CKMB 3.2  CKMBINDEX --  TROPONINI <0.30   Hemoglobin A1C:  Lab 05/31/12 2124  HGBA1C 5.8*   Fasting Lipid Panel:  Lab 05/31/12 2116  CHOL 176  HDL 49  LDLCALC 102*  TRIG 127  CHOLHDL 3.6  LDLDIRECT --   Micro Results: Recent Results (from the past 240 hour(s))  CSF CULTURE     Status: Normal (Preliminary result)   Collection Time   05/31/12  5:54 PM   Component Value Range Status Comment   Specimen Description CSF   Final    Special Requests TUBE 2@2 .2CC   Final    Gram Stain     Final    Value: CYTOSPIN WBC PRESENT, PREDOMINANTLY MONONUCLEAR     NO ORGANISMS SEEN     Performed at [facility]   Culture PENDING   Incomplete    Report Status PENDING   Incomplete   GRAM STAIN     Status: Normal   Collection Time   05/31/12  5:54 PM      Component Value Range Status Comment   Specimen Description CSF   Final    Special Requests TUBE 2@2 .2CC   Final    Gram Stain     Final    Value: CYTOSPIN SLIDE     WBC PRESENT, PREDOMINANTLY MONONUCLEAR     NO ORGANISMS SEEN   Report Status 05/31/2012 FINAL   Final    Studies/Results: Dg Chest 2 View  05/31/2012  *RADIOLOGY REPORT*  Clinical Data: Weakness.  Headache and numbness.  CHEST - 2 VIEW  Comparison: 05/31/2012  Findings: There is mild cardiac enlargement.  There is no pleural effusion or edema.  There is no  airspace consolidation identified.  Prior median sternotomy and CABG procedure.  IMPRESSION:  1.  No acute cardiopulmonary abnormalities.  Original Report Authenticated By: Rosealee Albee, M.D.   Ct Head Wo Contrast  05/31/2012  *RADIOLOGY REPORT*  Clinical Data: Dizziness CT, and weakness.  CT HEAD WITHOUT CONTRAST  Technique:  Contiguous axial images were obtained from the base of the skull through the vertex without contrast.  Comparison: None  Findings: No acute intracranial abnormality is identified. Specifically, there is no hemorrhage, hydrocephalus, mass effect, mass lesion, or evidence of acute infarction.  There is some mucosal thickening of the ethmoid air cells.  There are no air-fluid levels in the sinuses.  The skull is intact.  The soft tissues of the scalp are symmetric.  IMPRESSION:  1.  No acute intracranial abnormality. 2.  Chronic ethmoid sinus disease.  Original Report Authenticated By: Britta Mccreedy, M.D.   Mr Brain Wo Contrast  06/01/2012  *RADIOLOGY REPORT*   Clinical Data: Worst headache of life  MRI HEAD WITHOUT CONTRAST  Technique:  Multiplanar, multiecho pulse sequences of the brain and surrounding structures were obtained according to standard protocol without intravenous contrast.  Comparison: CT head 05/31/2012  Findings: Ventricle size is normal.  Negative for acute or chronic infarct.  Cerebral white matter is normal.  Brainstem and cerebellum are normal.  Negative for hemorrhage or mass lesion.  Vessels at the base of the brain are patent.  Chronic sinusitis with mucosal thickening in the paranasal sinuses. No air-fluid level.  IMPRESSION: No significant intracranial abnormality.  Sinusitis  Original Report Authenticated By: Camelia Phenes, M.D.   Dg Lumbar Puncture Fluoro Guide  05/31/2012  *RADIOLOGY REPORT*  Clinical Data:  Headaches and left arm numbness  DIAGNOSTIC LUMBAR PUNCTURE UNDER FLUOROSCOPIC GUIDANCE  Fluoroscopy time:  57 seconds .  Technique:  Informed consent was obtained from the patient prior to the procedure, including potential complications of headache, allergy, and pain.   With the patient prone, the lower back was prepped with Betadine.  1% Lidocaine was used for local anesthesia. Lumbar puncture was performed at the L3-4 level using a 22 gauge needle with return of clear CSF with an opening pressure of 16 cm water.   12 ml of CSF were obtained for laboratory studies.  The patient tolerated the procedure well and there were no apparent complications.  IMPRESSION: Technically successful lumbar puncture without complications.  Original Report Authenticated By: Rosealee Albee, M.D.   Medications: I have reviewed the patient's current medications. Scheduled Meds:   . aspirin EC  81 mg Oral Daily  . citalopram  40 mg Oral Daily  . heparin subcutaneous  5,000 Units Subcutaneous Q8H  . LORazepam  1 mg Intravenous Once  . simvastatin  20 mg Oral q1800  . DISCONTD: aspirin  325 mg Oral Daily   Continuous Infusions:   . sodium  chloride 100 mL/hr at 05/31/12 2244   PRN Meds:.acetaminophen, acetaminophen, alum & mag hydroxide-simeth, morphine injection, ondansetron (ZOFRAN) IV, ondansetron Assessment/Plan: Migraine versus TIA - TIA workup complete and no abnormalities. Will recommend close follow up with his PCP. Will advise if headaches recur to see a headache specialist. Will also recommend aspirin 81 mg PO daily. No stroke on MRI. Will be stable for discharge home today. Pt did not want to wait for PT as this would delay his discharge and he states that he can do everything for himself. LP was negative for bleed or hemorrhage in the brain.   Hypertension -  Will restart ACE-inhibitor on discharge. Talked to them about the importance of controlled BP long term.   Hyperlipidemia - LDL 102 on lipid panel and will not adjust medicine at this time but will send discharge summary to his PCP for their consideration. Continue statin.   Disposition - D/C home today.    LOS: 1 day   Genella Mech 06/01/2012, 11:41 AM

## 2012-06-01 NOTE — H&P (Signed)
Internal Medicine Teaching Service Attending Note Date: 06/01/2012  Patient name: Scott Burch  Medical record number: [id number]  Date of birth: 1956/02/17    This patient has been seen and discussed with the house staff. Please see their note for complete details. 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 He also has had some symptoms of lightheadedness and dizziness particularly when emptying his bladder gone on for several months at least. This past Saturday he began experiencing one of the "worst headache of my life". This lasted for 45 minutes. The headache was behind his right eye in radiating back into his head. It was not exacerbated by any loud noises or intense smells. He was under duress at the time in the sense that his wife had called him was concerned about the shoplifter at her shop. He went to bed later that evening after the headache has subsided and was drowsy the entire following day within a minute sleeping. On Monday is able to work with the [facility] emergency room. Due to extensive weight Candida not being seen in patient was eventually brought to the [facility] partly her account.  At count he apparently has some dysmetric with the finger to nose testing by the emergency department physician. He was being worked  for possible subarachnoid hemorrhage and had a lumbar puncture which was benign and showed no evidence of subarachnoid bleed. CT of the head was also normal. MRI of the head done this morning shows no evidence of stroke or other acute event the event. On my exam this morning he has slight asymmetry with smile and less expression on the left side of his face which I would not expect a right-handed individual. His wife claims he always has had this slight  asymmetry. Otherwise crown nerves are intact to strength and sensation are intact. I did not have him walk yet this morning.  This patient did not have a stroke by imaging. The severity of his headaches are disconcerting but he has been ruled out for sub-arachnoid hemorrhage and also has had an MRI of the brain. I suspect the headaches may be do to stress. He may be benefit from being seen by a headache specialist chronically. I would like to make sure that physical therapy has evaluated him and see how well he can walk and perform activities of daily living.   With regards to the dizziness and lightheadedness that happened in particular with emptying his bladder he may have had a near micturition syncope with stimulation of his vagus nerve. He's had similar symptoms with emotional stress.   I will discuss further with my team.  Paulette Blanch Dam 06/01/2012, 11:25 AM

## 2012-06-01 NOTE — Progress Notes (Signed)
Pt refused morning labs stated he doesn't understand what could've possibly changed from his 2000 labs. Pt was informed on the importance of getting his labs drawn. Pt still refused and stated he would talk with his doctor about it when he comes to see him later on today. Will continue to monitor the pt and pass on the next RN. Sanda Linger

## 2012-06-04 LAB — CSF CULTURE W GRAM STAIN

## 2013-07-21 HISTORY — PX: CERVICAL LAMINECTOMY: SHX94

## 2014-03-27 ENCOUNTER — Other Ambulatory Visit: Payer: Self-pay | Admitting: Urology

## 2014-03-30 ENCOUNTER — Encounter (HOSPITAL_BASED_OUTPATIENT_CLINIC_OR_DEPARTMENT_OTHER): Payer: Self-pay | Admitting: *Deleted

## 2014-04-04 ENCOUNTER — Encounter (HOSPITAL_BASED_OUTPATIENT_CLINIC_OR_DEPARTMENT_OTHER): Payer: Self-pay | Admitting: *Deleted

## 2014-04-04 NOTE — Progress Notes (Signed)
SPOKE W/ WIFE. NPO AFTER MN. ARRIVE AT 0945. NEEDS ISTAT. PT TO BRING CURRENT EKG AND CARDIOLOGIST LOV NOTE FROM [facility].

## 2014-04-06 ENCOUNTER — Encounter (HOSPITAL_BASED_OUTPATIENT_CLINIC_OR_DEPARTMENT_OTHER)
Admission: RE | Disposition: A | Discharge status: Home / Self Care | Payer: Self-pay | Source: Ambulatory Visit | Attending: Urology

## 2014-04-06 ENCOUNTER — Ambulatory Visit (HOSPITAL_BASED_OUTPATIENT_CLINIC_OR_DEPARTMENT_OTHER): Payer: 59 | Admitting: Anesthesiology

## 2014-04-06 ENCOUNTER — Ambulatory Visit (HOSPITAL_BASED_OUTPATIENT_CLINIC_OR_DEPARTMENT_OTHER)
Admission: RE | Admit: 2014-04-06 | Discharge: 2014-04-06 | Disposition: A | Discharge status: Home / Self Care | Payer: 59 | Source: Ambulatory Visit | Attending: Urology | Admitting: Urology

## 2014-04-06 ENCOUNTER — Encounter (HOSPITAL_BASED_OUTPATIENT_CLINIC_OR_DEPARTMENT_OTHER): Payer: Self-pay | Admitting: *Deleted

## 2014-04-06 ENCOUNTER — Encounter (HOSPITAL_BASED_OUTPATIENT_CLINIC_OR_DEPARTMENT_OTHER): Payer: 59 | Admitting: Anesthesiology

## 2014-04-06 DIAGNOSIS — R011 Cardiac murmur, unspecified: Secondary | ICD-10-CM | POA: Insufficient documentation

## 2014-04-06 DIAGNOSIS — Z91041 Radiographic dye allergy status: Secondary | ICD-10-CM | POA: Insufficient documentation

## 2014-04-06 DIAGNOSIS — M129 Arthropathy, unspecified: Secondary | ICD-10-CM | POA: Insufficient documentation

## 2014-04-06 DIAGNOSIS — N529 Male erectile dysfunction, unspecified: Secondary | ICD-10-CM | POA: Insufficient documentation

## 2014-04-06 DIAGNOSIS — N201 Calculus of ureter: Secondary | ICD-10-CM | POA: Insufficient documentation

## 2014-04-06 DIAGNOSIS — N400 Enlarged prostate without lower urinary tract symptoms: Secondary | ICD-10-CM | POA: Insufficient documentation

## 2014-04-06 DIAGNOSIS — Z79899 Other long term (current) drug therapy: Secondary | ICD-10-CM | POA: Insufficient documentation

## 2014-04-06 DIAGNOSIS — J45909 Unspecified asthma, uncomplicated: Secondary | ICD-10-CM | POA: Insufficient documentation

## 2014-04-06 DIAGNOSIS — I10 Essential (primary) hypertension: Secondary | ICD-10-CM | POA: Insufficient documentation

## 2014-04-06 DIAGNOSIS — K219 Gastro-esophageal reflux disease without esophagitis: Secondary | ICD-10-CM | POA: Insufficient documentation

## 2014-04-06 DIAGNOSIS — E7800 Pure hypercholesterolemia, unspecified: Secondary | ICD-10-CM | POA: Insufficient documentation

## 2014-04-06 DIAGNOSIS — N200 Calculus of kidney: Secondary | ICD-10-CM | POA: Insufficient documentation

## 2014-04-06 HISTORY — DX: Other cervical disc degeneration, unspecified cervical region: M50.30

## 2014-04-06 HISTORY — DX: Personal history of (corrected) congenital malformations of heart and circulatory system: Z87.74

## 2014-04-06 HISTORY — PX: HOLMIUM LASER APPLICATION: SHX5852

## 2014-04-06 HISTORY — DX: Left bundle-branch block, unspecified: I44.7

## 2014-04-06 HISTORY — PX: CYSTOSCOPY WITH RETROGRADE PYELOGRAM, URETEROSCOPY AND STENT PLACEMENT: SHX5789

## 2014-04-06 HISTORY — DX: Frequency of micturition: R35.0

## 2014-04-06 HISTORY — DX: Personal history of urinary calculi: Z87.442

## 2014-04-06 HISTORY — DX: Urgency of urination: R39.15

## 2014-04-06 HISTORY — DX: Benign prostatic hyperplasia without lower urinary tract symptoms: N40.0

## 2014-04-06 LAB — POCT I-STAT 4, (NA,K, GLUC, HGB,HCT)
Glucose, Bld: 98 mg/dL (ref 70–99)
HEMATOCRIT: 42 % (ref 39.0–52.0)
HEMOGLOBIN: 14.3 g/dL (ref 13.0–17.0)
Potassium: 4.1 mEq/L (ref 3.7–5.3)
Sodium: 141 mEq/L (ref 137–147)

## 2014-04-06 SURGERY — CYSTOURETEROSCOPY, WITH RETROGRADE PYELOGRAM AND STENT INSERTION
Anesthesia: General | Site: Ureter | Laterality: Left

## 2014-04-06 MED ORDER — ONDANSETRON HCL 4 MG/2ML IJ SOLN
INTRAMUSCULAR | Status: DC | PRN
  Administered 2014-04-06: 4 mg via INTRAVENOUS

## 2014-04-06 MED ORDER — BELLADONNA ALKALOIDS-OPIUM 16.2-60 MG RE SUPP
RECTAL | Status: AC
  Filled 2014-04-06: qty 1

## 2014-04-06 MED ORDER — LIDOCAINE HCL (CARDIAC) 20 MG/ML IV SOLN
INTRAVENOUS | Status: DC | PRN
  Administered 2014-04-06: 100 mg via INTRAVENOUS

## 2014-04-06 MED ORDER — MIDAZOLAM HCL 5 MG/5ML IJ SOLN
INTRAMUSCULAR | Status: DC | PRN
  Administered 2014-04-06: 2 mg via INTRAVENOUS

## 2014-04-06 MED ORDER — CIPROFLOXACIN IN D5W 400 MG/200ML IV SOLN
400.0000 mg | INTRAVENOUS | Status: AC
  Administered 2014-04-06: 400 mg via INTRAVENOUS
  Filled 2014-04-06: qty 200

## 2014-04-06 MED ORDER — DEXAMETHASONE SODIUM PHOSPHATE 4 MG/ML IJ SOLN
INTRAMUSCULAR | Status: DC | PRN
  Administered 2014-04-06: 10 mg via INTRAVENOUS

## 2014-04-06 MED ORDER — TRIMETHOPRIM 100 MG PO TABS: 100.0000 mg | ORAL_TABLET | ORAL | Status: DC

## 2014-04-06 MED ORDER — IOHEXOL 350 MG/ML SOLN
INTRAVENOUS | Status: DC | PRN
  Administered 2014-04-06: 2 mL via INTRAVENOUS

## 2014-04-06 MED ORDER — BELLADONNA ALKALOIDS-OPIUM 16.2-60 MG RE SUPP
RECTAL | Status: DC | PRN
  Administered 2014-04-06: 1 via RECTAL

## 2014-04-06 MED ORDER — METOCLOPRAMIDE HCL 5 MG/ML IJ SOLN
INTRAMUSCULAR | Status: DC | PRN
  Administered 2014-04-06: 10 mg via INTRAVENOUS

## 2014-04-06 MED ORDER — PROPOFOL 10 MG/ML IV BOLUS
INTRAVENOUS | Status: DC | PRN
  Administered 2014-04-06: 200 mg via INTRAVENOUS

## 2014-04-06 MED ORDER — LACTATED RINGERS IV SOLN
INTRAVENOUS | Status: DC
  Administered 2014-04-06 (×2): via INTRAVENOUS
  Filled 2014-04-06: qty 1000

## 2014-04-06 MED ORDER — ACETAMINOPHEN 10 MG/ML IV SOLN
1000.0000 mg | Freq: Once | INTRAVENOUS | Status: AC
  Administered 2014-04-06: 1000 mg via INTRAVENOUS
  Filled 2014-04-06: qty 100

## 2014-04-06 MED ORDER — PROMETHAZINE HCL 25 MG/ML IJ SOLN
6.2500 mg | INTRAMUSCULAR | Status: DC | PRN
  Filled 2014-04-06: qty 1

## 2014-04-06 MED ORDER — FENTANYL CITRATE 0.05 MG/ML IJ SOLN
INTRAMUSCULAR | Status: AC
  Filled 2014-04-06: qty 2

## 2014-04-06 MED ORDER — MIDAZOLAM HCL 2 MG/2ML IJ SOLN
INTRAMUSCULAR | Status: AC
  Filled 2014-04-06: qty 2

## 2014-04-06 MED ORDER — FENTANYL CITRATE 0.05 MG/ML IJ SOLN
INTRAMUSCULAR | Status: DC | PRN
  Administered 2014-04-06: 50 ug via INTRAVENOUS
  Administered 2014-04-06 (×3): 25 ug via INTRAVENOUS
  Administered 2014-04-06 (×2): 50 ug via INTRAVENOUS
  Administered 2014-04-06: 25 ug via INTRAVENOUS
  Administered 2014-04-06: 50 ug via INTRAVENOUS

## 2014-04-06 MED ORDER — TAMSULOSIN HCL 0.4 MG PO CAPS: 0.4000 mg | ORAL_CAPSULE | Freq: Every day | ORAL | Status: DC

## 2014-04-06 MED ORDER — KETOROLAC TROMETHAMINE 30 MG/ML IJ SOLN
INTRAMUSCULAR | Status: DC | PRN
  Administered 2014-04-06: 30 mg via INTRAVENOUS

## 2014-04-06 MED ORDER — PHENAZOPYRIDINE HCL 200 MG PO TABS: 200.0000 mg | ORAL_TABLET | Freq: Three times a day (TID) | ORAL | Status: DC | PRN

## 2014-04-06 MED ORDER — OXYCODONE-ACETAMINOPHEN 5-325 MG PO TABS: 1.0000 | ORAL_TABLET | ORAL | Status: DC | PRN

## 2014-04-06 MED ORDER — FENTANYL CITRATE 0.05 MG/ML IJ SOLN
INTRAMUSCULAR | Status: AC
  Filled 2014-04-06: qty 4

## 2014-04-06 MED ORDER — HYDROMORPHONE HCL PF 1 MG/ML IJ SOLN
0.2500 mg | INTRAMUSCULAR | Status: DC | PRN
  Filled 2014-04-06: qty 1

## 2014-04-06 MED ORDER — PHENAZOPYRIDINE HCL 200 MG PO TABS
200.0000 mg | ORAL_TABLET | Freq: Once | ORAL | Status: AC
  Administered 2014-04-06: 200 mg via ORAL
  Filled 2014-04-06: qty 1

## 2014-04-06 MED ORDER — OXYCODONE HCL 5 MG PO TABS
ORAL_TABLET | ORAL | Status: AC
  Filled 2014-04-06: qty 1

## 2014-04-06 MED ORDER — OXYCODONE HCL 5 MG PO TABS
5.0000 mg | ORAL_TABLET | Freq: Once | ORAL | Status: AC | PRN
  Administered 2014-04-06: 5 mg via ORAL
  Filled 2014-04-06: qty 1

## 2014-04-06 MED ORDER — SODIUM CHLORIDE 0.9 % IR SOLN
Status: DC | PRN
  Administered 2014-04-06: 3000 mL via INTRAVESICAL

## 2014-04-06 MED ORDER — ACETAMINOPHEN 10 MG/ML IV SOLN: INTRAVENOUS | Status: DC | PRN

## 2014-04-06 MED ORDER — OXYCODONE HCL 5 MG/5ML PO SOLN
5.0000 mg | Freq: Once | ORAL | Status: AC | PRN
  Filled 2014-04-06: qty 5

## 2014-04-06 MED ORDER — MEPERIDINE HCL 25 MG/ML IJ SOLN
6.2500 mg | INTRAMUSCULAR | Status: DC | PRN
  Filled 2014-04-06: qty 1

## 2014-04-06 MED ORDER — PHENAZOPYRIDINE HCL 100 MG PO TABS
ORAL_TABLET | ORAL | Status: AC
  Filled 2014-04-06: qty 2

## 2014-04-06 SURGICAL SUPPLY — 24 items
6 X 24 CONTOUR STENT ×1 IMPLANT
ADAPTER CATH URET PLST 4-6FR (CATHETERS) ×1 IMPLANT
ADPR CATH URET STRL DISP 4-6FR (CATHETERS) ×1
BAG DRAIN URO-CYSTO SKYTR STRL (DRAIN) ×2 IMPLANT
BAG DRN UROCATH (DRAIN) ×1
BASKET ZERO TIP NITINOL 2.4FR (BASKET) ×2 IMPLANT
BOOTIES KNEE HIGH SLOAN (MISCELLANEOUS) ×2 IMPLANT
BSKT STON RTRVL ZERO TP 2.4FR (BASKET) ×2
CANISTER SUCT LVC 12 LTR MEDI- (MISCELLANEOUS) ×1 IMPLANT
CATH INTERMIT  6FR 70CM (CATHETERS) ×1 IMPLANT
CLOTH BEACON ORANGE TIMEOUT ST (SAFETY) ×2 IMPLANT
DRAPE CAMERA CLOSED 9X96 (DRAPES) ×2 IMPLANT
GLOVE BIO SURGEON STRL SZ 6.5 (GLOVE) ×1 IMPLANT
GLOVE BIO SURGEON STRL SZ7 (GLOVE) ×2 IMPLANT
GLOVE INDICATOR 6.5 STRL GRN (GLOVE) ×1 IMPLANT
GOWN STRL REUS W/ TWL LRG LVL3 (GOWN DISPOSABLE) ×1 IMPLANT
GOWN STRL REUS W/ TWL XL LVL3 (GOWN DISPOSABLE) IMPLANT
GOWN STRL REUS W/TWL LRG LVL3 (GOWN DISPOSABLE) ×2
GOWN STRL REUS W/TWL XL LVL3 (GOWN DISPOSABLE) ×2
GUIDEWIRE STR DUAL SENSOR (WIRE) ×1 IMPLANT
IV NS IRRIG 3000ML ARTHROMATIC (IV SOLUTION) ×4 IMPLANT
SHEATH ACCESS URETERAL 38CM (SHEATH) ×1 IMPLANT
SHEATH ACCESS URETERAL 54CM (SHEATH) IMPLANT
STENT URET 6FRX24 CONTOUR (STENTS) ×1 IMPLANT

## 2014-04-06 NOTE — Op Note (Signed)
Pre-operative diagnosis :   Left UPJ stone  Postoperative diagnosis:  Left upper pole calyceal stone, impacted left UPJ stone with ingrowth into the ureteral wall  Operation:  Cystourethroscopy, left retrograde PolyGram interpretation, flexible ureteroscopy with the Olympus 8.4 [place]French flex we rescoped (digital) and the 4 French fiberoptic flexible ureteroscope, with laser fragmentation of left upper pole stone, and fragmentation of left UPJ stone in gross.  Surgeon:  Kathie RhodesS. Patsi Searsannenbaum, MD  First assistant: None   Anesthesia:  General LMA  Preparation:  After appropriate Preanesthesia, the patient was brought to the operating room, placed on the operating table in dorsal supine position where general LMA anesthesia was introduced. He was replaced in dorsal lithotomy position with pubis was prepped with antibiotic soap solution (Betadine allergy) solution draped in usual fashion. The arm band was double checked. The history was double checked.   Review history:   [name] has a history of BPH, ED and kidney stones. He remains on alfuzosin and finasteride for his LUTS. His PCP follows his PSA.  He has been having intermittent left flank pain for almost 2 months. A CT on 03/06/14 showed a 4 mm stone within the left extra renal pelvis as well as several other left non obstructing renal stones. Dr. Patsi Searsannenbaum offered him ureteroscopy and stone extraction but he was asymptomatic at the time and denied treatment.  Interval: Patient has been having intermittent pain, requiring pain medication. Most recently seen on 4/2 when he was intermittently symptomatic. He has requested that any procedure be performed by Dr. Patsi Searsannenbaum.      Statement of  Likelihood of Success: Excellent. TIME-OUT observed.:  Procedure:  Cystourethroscopy was accomplished which showed normal urethra. The bladder base was normal. The bladder neck was normal. Left retrograde PolyGram was performed which showed normal appearing ureter.  A 0.038 guidewire was then placed in the renal pelvis, and over this, a protective dilating sheath was placed a medium length. The Olympus 8.4 French flexible digital ureteroscope was then passed into the upper ureter, and into the renal pelvis. Stone was identified in the upper pole pelvis, and was felt that the stone originally identified in the renal pelvis was flushed into the upper pole. The stone was able to be placed a basket, but could not be brought down through the UPJ. Therefore, the basket was clamped, cut. The laser fiber was then passed through the ureter scope(365 ), and using laser settings of 0.5 and 5, and 0.75 and 5, the stone was fragmented, and basket extracted. Repeat ureteroscopy with the small flexible digital scope showed stone material underneath the UPJ, growing into the wall. It was felt that this was wide the UPJ was tight, left for the stones could not pass. The UPJ was not completely closed, and over. Laser fragmentation of the stone was further kidney, but I felt that I wouldn't injure the wall of the ureter by further for laser fragmentation. Therefore, this was stopped, and a 6 x 24 semiurgent double-J stent was placed, with a suture attached, for patient removal. The patient received IV Tylenol and IV Toradol, and a B. and O. suppository. He was awakened, and taken to recovery room in good condition.

## 2014-04-06 NOTE — Anesthesia Preprocedure Evaluation (Signed)
Anesthesia Evaluation  Patient identified by MRN, date of birth, ID band Patient awake    Reviewed: Allergy & Precautions, H&P , NPO status , Patient's Chart, lab work & pertinent test results  Airway Mallampati: II TM Distance: >3 FB Neck ROM: Full    Dental  (+) Dental Advisory Given   Pulmonary neg pulmonary ROS,  breath sounds clear to auscultation        Cardiovascular hypertension, Pt. on medications + dysrhythmias Rhythm:Regular Rate:Normal     Neuro/Psych negative neurological ROS  negative psych ROS   GI/Hepatic negative GI ROS, Neg liver ROS,   Endo/Other  negative endocrine ROS  Renal/GU Renal diseasenegative Renal ROS     Musculoskeletal negative musculoskeletal ROS (+)   Abdominal   Peds  Hematology negative hematology ROS (+)   Anesthesia Other Findings   Reproductive/Obstetrics                           Anesthesia Physical Anesthesia Plan  ASA: II  Anesthesia Plan: General   Post-op Pain Management:    Induction: Intravenous  Airway Management Planned: LMA  Additional Equipment:   Intra-op Plan:   Post-operative Plan: Extubation in OR  Informed Consent: I have reviewed the patients History and Physical, chart, labs and discussed the procedure including the risks, benefits and alternatives for the proposed anesthesia with the patient or authorized representative who has indicated his/her understanding and acceptance.   Dental advisory given  Plan Discussed with: CRNA  Anesthesia Plan Comments:         Anesthesia Quick Evaluation

## 2014-04-06 NOTE — H&P (Signed)
Reason For Visit   f/u for left renal colic   History of Present Illness  [name] has a history of BPH, ED and kidney stones. He remains on alfuzosin and finasteride for his LUTS. His PCP follows his PSA. He has been having intermittent left flank pain for almost 2 months. A CT on 03/06/14 showed a 4 mm stone within the left extra renal pelvis as well as several other left non obstructing renal stones. Dr. Patsi Searsannenbaum offered him ureteroscopy and stone extraction but he was asymptomatic at the time and denied treatment. Interval: Patient has been having intermittent pain, requiring pain medication. Most recently seen on 4/2 when he was intermittently symptomatic.  He has requested that any procedure be performed by Dr. Patsi Searsannenbaum.   Past Medical History Problems   1. History of Arthritis (V13.4)  2. History of Asthma (493.90)  3. History of cardiac disorder (V12.50)  4. History of esophageal reflux (V12.79)  5. History of hypercholesterolemia (V12.29)  6. History of hypertension (V12.59)  7. History of kidney stones (V13.01)  8. History of Murmur (785.2)  Surgical History Problems   1. History of Heart Surgery  2. History of Lithotripsy  3. History of Neck Surgery  Current Meds  1. Alfuzosin HCl ER 10 MG Oral Tablet Extended Release 24 Hour; TAKE 1 TABLET  Bedtime;  Therapy: 05Oct2011 to (Evaluate:13Jun2015)  Requested for: 23Jun2014; Last  Rx:18Jun2014 Ordered  2. Finasteride 5 MG Oral Tablet; TAKE ONE TABLET BY MOUTH DAILY;  Therapy: 30Dec2014 to (Evaluate:25Dec2015)  Requested for: 30Dec2014; Last  Rx:30Dec2014 Ordered  3. Fish Oil CAPS;  Therapy: (Recorded:21Sep2010) to Recorded  4. Hydrocodone-Acetaminophen 5-325 MG Oral Tablet; TAKE 1 TO 2 TABLETS EVERY 4 TO  6 HOURS AS NEEDED;  Therapy: 30Dec2014 to (Evaluate:06Apr2015); Last Rx:02Apr2015 Ordered  5. Lisinopril 20 MG Oral Tablet;  Therapy: 21Dec2010 to Recorded  6. Promethazine HCl - 25 MG Oral Tablet; TAKE 1  TABLET EVERY 6 HOURS AS NEEDED  FOR NAUSEA;  Therapy: 05Feb2015 to (Evaluate:04Apr2015)  Requested for: 30Mar2015; Last  Rx:30Mar2015 Ordered  7. Simvastatin 20 MG Oral Tablet;  Therapy: 21Dec2010 to Recorded  8. Staxyn 10 MG Oral Tablet Dispersible; TAKE 10 MG As Directed;  Therapy: 13Sep2011 to (Last Rx:13Sep2011) Ordered  9. Urocit-K 15 15 MEQ (1620 MG) Oral Tablet Extended Release; TAKE 2 TABLET Twice  daily;  Therapy: 13Oct2010 to (Evaluate:04Apr2013)  Requested for: 06Sep2012; Last  Rx:06Sep2012 Ordered  10. Vitamin D (Ergocalciferol) 50000 UNIT Oral Capsule;   Therapy: (Recorded:30Dec2014) to Recorded  Allergies Medication   1. Iodine SOLN  Family History Problems   1. Family history of Family Health Status Number Of Children   1 daughter  2. No pertinent family history : Mother  Social History Problems   1. Alcohol Use   socially  2. Caffeine Use   2-4 cola  3. Marital History - Currently Married  4. Never A Smoker  5. Occupation:   VP  6. Denied: History of Tobacco Use  Review of Systems   No changes in pts bowel habits, neurological changes, or progressive lower urinary tract symptoms.      Physical Exam Constitutional: Well nourished and well developed.   Pulmonary: No respiratory distress and normal respiratory rhythm and effort.   Cardiovascular: Heart rate and rhythm are normal . No obvious murmurs are appreciated.   Abdomen: No CVA tenderness.    Results/Data  The following images/tracing/specimen were independently visualized: .  CT scan from 3/17 with left UPJ stone  and several others in the left lower pole.    Discussion/Summary   I again discussed the treatment options with the patient including continued medical management and ureteroscopy.  It is unlikely that the patient is going to pass it stone located in the UPJ, and ureteroscopy was allow us to also clean out the rest of the kidney.  One option we discussed was to have a stent  placed this afternoon which would ensure the patient did not develop renal colic again, and would allow for ureteral dilation and making ureteroscopy much easier and better chance of being stone free after the procedure.   I discussed the risks and benefits of the operation in detail.  The patient understands that he again will likely need a stent postoperatively for 5-7 days.  I offered to perform this procedure for the patient or book the procedure with Dr. Cassell Smilesanenbaum.

## 2014-04-06 NOTE — Interval H&P Note (Signed)
History and Physical Interval Note:  04/06/2014 12:20 PM  Scott Burch  has presented today for surgery, with the diagnosis of LEFT RENAL PELVIC STONE  The various methods of treatment have been discussed with the patient and family. After consideration of risks, benefits and other options for treatment, the patient has consented to  Procedure(s): CYSTOSCOPY WITH LEFT RETROGRADE PYELOGRAM, URETEROSCOPY, STONE EXTRACTION AND POSSIBLE DOUBLE J STENT PLACEMENT (Left) HOLMIUM LASER APPLICATION (Left) as a surgical intervention .  The patient's history has been reviewed, patient examined, no change in status, stable for surgery.  I have reviewed the patient's chart and labs.  Questions were answered to the patient's satisfaction.     Kathi LudwigSigmund I Geraldyn Shain

## 2014-04-06 NOTE — Transfer of Care (Signed)
Immediate Anesthesia Transfer of Care Note  Patient: Scott Burch  Procedure(s) Performed: Procedure(s) (LRB): CYSTOSCOPY WITH LEFT RETROGRADE PYELOGRAM, LEFT URETEROSCOPY, STONE EXTRACTION , DOUBLE J STENT PLACEMENT (Left) HOLMIUM LASER APPLICATION (Left)  Patient Location: PACU  Anesthesia Type: General  Level of Consciousness: awake, alert  and oriented  Airway & Oxygen Therapy: Patient Spontanous Breathing and Patient connected to face mask oxygen  Post-op Assessment: Report given to PACU RN and Post -op Vital signs reviewed and stable  Post vital signs: Reviewed and stable  Complications: No apparent anesthesia complications

## 2014-04-06 NOTE — Discharge Instructions (Addendum)
Ureteral Colic (Kidney Stones) Ureteral colic is the result of a condition when kidney stones form inside the kidney. Once kidney stones are formed they Suder move into the tube that connects the kidney with the bladder (ureter). If this occurs, this condition Feigenbaum cause pain (colic) in the ureter.  CAUSES  Pain is caused by stone movement in the ureter and the obstruction caused by the stone. SYMPTOMS  The pain comes and goes as the ureter contracts around the stone. The pain is usually intense, sharp, and stabbing in character. The location of the pain Bhavsar move as the stone moves through the ureter. When the stone is near the kidney the pain is usually located in the back and radiates to the belly (abdomen). When the stone is ready to pass into the bladder the pain is often located in the lower abdomen on the side the stone is located. At this location, the symptoms Zaugg mimic those of a urinary tract infection with urinary frequency. Once the stone is located here it often passes into the bladder and the pain disappears completely. TREATMENT   Your caregiver will provide you with medicine for pain relief.  You Kelnhofer require specialized follow-up X-rays.  The absence of pain does not always mean that the stone has passed. It Witman have just stopped moving. If the urine remains completely obstructed, it can cause loss of kidney function or even complete destruction of the involved kidney. It is your responsibility and in your interest that X-rays and follow-ups as suggested by your caregiver are completed. Relief of pain without passage of the stone can be associated with severe damage to the kidney, including loss of kidney function on that side.  If your stone does not pass on its own, additional measures Krasinski be taken by your caregiver to ensure its removal. HOME CARE INSTRUCTIONS   Increase your fluid intake. Water is the preferred fluid since juices containing vitamin C Rathje acidify the urine making it  less likely for certain stones (uric acid stones) to pass.  Strain all urine. A strainer will be provided. Keep all particulate matter or stones for your caregiver to inspect.  Take your pain medicine as directed.  Make a follow-up appointment with your caregiver as directed.  Remember that the goal is passage of your stone. The absence of pain does not mean the stone is gone. Follow your caregiver's instructions.  Only take over-the-counter or prescription medicines for pain, discomfort, or fever as directed by your caregiver. SEEK MEDICAL CARE IF:   Pain cannot be controlled with the prescribed medicine.  You have a fever.  Pain continues for longer than your caregiver advises it should.  There is a change in the pain, and you develop chest discomfort or constant abdominal pain.  You feel faint or pass out. MAKE SURE YOU:   Understand these instructions.  Will watch your condition.  Will get help right away if you are not doing well or get worse. Document Released: 09/16/2005 Document Revised: 04/03/2013 Document Reviewed: 06/03/2011 [facility] SurgeryExit[facility] Patient Information 2014 [place]ExitCare, [place]LL[place].  Diet for Kidney Stones Kidney stones are small, hard masses that form inside your kidneys. They are made up of salts and minerals and often form when high levels build up in the urine. The minerals can then start to build up, crystalize, and stick together to form stones. There are several different types of kidney stones. The following types of stones Melone be influenced by dietary factors:   Calcium Oxalate Stones.  An oxalate is a salt found in certain foods. Within the body, calcium can combine with oxalates to form calcium oxalate stones, which can be excreted in the urine in high amounts. This is the most common type of kidney stone.  Calcium Phosphate Stones. These stones may occur when the pH of the urine becomes too high, or less acidic, from too much calcium being excreted in the urine. The  pH is a measure of how acidic or basic a substance is.  Uric Acid Stones. This type of stone occurs when the pH of the urine becomes too low, or very acidic, because substances called purines build up in the urine. Purines are found in animal proteins. When the urine is highly concentrated with acid, uric acid kidney stones can form.  Other risk factors for kidney stones include genetics, environment, and being overweight. Your caregiver may ask you to follow specific diet guidelines based on the type of stone you have to lessen the chances of your body making more kidney stones.  GENERAL GUIDELINES FOR ALL TYPES OF STONES  Drink plenty of fluid. Drink 12 16 cups of fluid a day, drinking mainly water.This is the most important thing you can do to prevent the formation of future kidney stones.  Maintain a healthy weight. Your caregiver or dietitian can help you determine what a healthy weight is for you. If you are overweight, weight loss may help prevent the formation of future kidney stones.  Eat a diet adequate in animal protein. Too much animal protein can contribute to the formation of stones. Your dietitian can help you determine how much protein you should be eating. Avoid low carbohydrate, high protein diets.  Follow a balanced eating approach. The DASH diet, which stands for "Dietary Approaches to Stop Hypertension," is an effective meal plan for reducing stone formation. This diet is high in fruits, vegetables, dairy, and whole grains and low in animal protein. Ask your caregiver or dietitian for information about the DASH diet. ADDITIONAL DIET GUIDELINES FOR CALCIUM STONES Avoid foods high in salt. This includes table salt, salt seasonings, MSG, soy sauce, cured and processed meats, salted crackers and snack foods, fast food, and canned soups and foods. Ask your caregiver or dietitian for information about reducing sodium in your diet or following the low sodium diet.  Ensure adequate  calcium intake. Use the following table for calcium guidelines:  Men 56 years old and younger  1000 mg/day.  Men 58 years old and older  1500 mg/day.  Women 87 56 years old  1000 mg/day.  Women 50 years and older  1500 mg/day. Your dietitian can help you determine if you are getting enough calcium in your diet. Foods that are high in calcium include dairy products, broccoli, cheese, yogurt, and pudding. If you need to take a calcium supplement, take it only in the form of calcium citrate.  Avoid foods high in oxalate. Be sure that any supplements you take do not contain more than 500 mg of vitamin C. Vitamin C is converted into oxalate in the body. You do not need to avoid fruits and vegetables high in vitamin C.   Grains: High-fiber or bran cereal, whole-wheat bread, grits, barley, buckwheat, amaranth, pretzels, and fruitcake.  Vegetables: Dried beans, wax beans, dark leafy greens, eggplant, leeks, okra, parsley, rutabaga, tomato paste, watercress, zucchini, and escarole.  Fruit: Dried apricots, red currants, figs, kiwi, and rhubarb.  Meat and Meat Substitutes: Soybeans and foods made from soy (soyburger, miso), dried beans,  peanut butter.  Milk: Chocolate milk mixes and soymilk.  Fats and Oils: Nuts (peanuts, almonds, pecans, cashews, hazelnuts) and nut butters, sesame seeds, and tDahini paste.  Condiments/Miscellaneous: Chocolate, carob, marmalade, poppy seeds, instant iced tea, and juice from high-oxalate fruits.  Document Released: 04/03/2011 Document Revised: 06/07/2012 Document Reviewed: 05/23/2012 [facility]ExitCare Patient Information 2014 [place]ExitCare, [place]LL[place].  Kidney Stones Kidney stones (urolithiasis) are solid masses that form inside your kidneys. The intense pain is caused by the stone moving through the kidney, ureter, bladder, and urethra (urinary tract). When the stone moves, the ureter starts to spasm around the stone. The stone is usually passed in your pee (urine).  HOME  CARE  Drink enough fluids to keep your pee clear or pale yellow. This helps to get the stone out.  Strain all pee through the provided strainer. Do not pee without peeing through the strainer, not even once. If you pee the stone out, catch it in the strainer. The stone may be as small as a grain of salt. Take this to your doctor. This will help your doctor figure out what you can do to try to prevent more kidney stones.  Only take medicine as told by your doctor.  Follow up with your doctor as told.  Get follow-up X-rays as told by your doctor. GET HELP IF: You have pain that gets worse even if you have been taking pain medicine. GET HELP RIGHT AWAY IF:   Your pain does not get better with medicine.  You have a fever or shaking chills.  Your pain increases and gets worse over 18 hours.  You have new belly (abdominal) pain.  You feel faint or pass out.  You are unable to pee. MAKE SURE YOU:   Understand these instructions.  Will watch your condition.  Will get help right away if you are not doing well or get worse. Document Released: 05/25/2008 Document Revised: 08/09/2013 Document Reviewed: 05/10/2013 [facility]Exit[facility] Patient Information 2014 [place]ExitCare, [place]LL[place].  Post Anesthesia Home Care Instructions  Activity: Get plenty of rest for the remainder of the day. A responsible adult should stay with you for 24 hours following the procedure.  For the next 24 hours, DO NOT: -Drive a car -Advertising copywriterperate machinery -Drink alcoholic beverages -Take any medication unless instructed by your physician -Make any legal decisions or sign important papers.  Meals: Start with liquid foods such as gelatin or soup. Progress to regular foods as tolerated. Avoid greasy, spicy, heavy foods. If nausea and/or vomiting occur, drink only clear liquids until the nausea and/or vomiting subsides. Call your physician if vomiting continues.  Special Instructions/Symptoms: Your throat may feel dry or sore from the  anesthesia or the breathing tube placed in your throat during surgery. If this causes discomfort, gargle with warm salt water. The discomfort should disappear within 24 hours. [facility] [phone] Post Ureteroscopy With or Without Stent Instructions  Definitions:  Ureter: The duct that transports urine from the kidney to the bladder. Stent:   A plastic hollow tube that is placed into the ureter, from the kidney to the                 bladder to prevent the ureter from swelling shut.  GENERAL INSTRUCTIONS:  Despite the fact that no skin incisions were used, the area around the ureter and bladder is raw and irritated. The stent is a foreign body which will further irritate the bladder wall. This irritation is manifested by increased frequency of urination, both day and  night, and by an increase in the urge to urinate. In some, the urge to urinate is present almost always. Sometimes the urge is strong enough that you may not be able to stop yourself from urinating. The only real cure is to remove the stent and then give time for the bladder wall to heal which can't be done until the danger of the ureter swelling shut has passed, which varies.  You may see some blood in your urine while the stent is in place and a few days afterwards. Do not be alarmed, even if the urine was clear for a while. Get off your feet and drink lots of fluids until clearing occurs. If you start to pass clots or don't improve, call us.  DIET: You may return to your normal diet immediately. Because of the raw surface of your bladder, alcohol, spicy foods, acid type foods and drinks with caffeine may cause irritation or frequency and should be used in moderation. To keep your urine flowing freely and to avoid constipation, drink plenty of fluids during the day ( 8-10 glasses ). Tip: Avoid cranberry juice because it is very acidic.  ACTIVITY: Your physical activity doesn't need to be restricted. However,  if you are very active, you may see some blood in your urine. We suggest that you reduce your activity under these circumstances until the bleeding has stopped.  BOWELS: It is important to keep your bowels regular during the postoperative period. Straining with bowel movements can cause bleeding. A bowel movement every other day is reasonable. Use a mild laxative if needed, such as Milk of Magnesia 2-3 tablespoons, or 2 Dulcolax tablets. Call if you continue to have problems. If you have been taking narcotics for pain, before, during or after your surgery, you may be constipated. Take a laxative if necessary.   MEDICATION: You should resume your pre-surgery medications unless told not to. In addition you will often be given an antibiotic to prevent infection. These should be taken as prescribed until the bottles are finished unless you are having an unusual reaction to one of the drugs.  PROBLEMS YOU SHOULD REPORT TO US:  Fevers over 100.5 Fahrenheit.  Heavy bleeding, or clots ( See above notes about blood in urine ).  Inability to urinate.  Drug reactions ( hives, rash, nausea, vomiting, diarrhea ).  Severe burning or pain with urination that is not improving.  FOLLOW-UP: You will need a follow-up appointment to monitor your progress. Call for this appointment at the number listed above. Usually the first appointment will be about three to fourteen days after your surgery.

## 2014-04-07 NOTE — Anesthesia Postprocedure Evaluation (Signed)
Anesthesia Post Note  Patient: Scott Burch  Procedure(s) Performed: Procedure(s) (LRB): CYSTOSCOPY WITH LEFT RETROGRADE PYELOGRAM, LEFT URETEROSCOPY, STONE EXTRACTION , DOUBLE J STENT PLACEMENT (Left) HOLMIUM LASER APPLICATION (Left)  Anesthesia type: General  Patient location: PACU  Post pain: Pain level controlled  Post assessment: Post-op Vital signs reviewed  Last Vitals: BP 132/80  Pulse 96  Temp(Src) 36 C (Oral)  Resp 16  Ht 5\' 9"  (1.753 m)  Wt 200 lb 8 oz (90.946 kg)  BMI 29.60 kg/m2  SpO2 97%  Post vital signs: Reviewed  Level of consciousness: sedated  Complications: No apparent anesthesia complications

## 2014-04-09 ENCOUNTER — Encounter (HOSPITAL_BASED_OUTPATIENT_CLINIC_OR_DEPARTMENT_OTHER): Payer: Self-pay | Admitting: Urology

## 2014-04-09 ENCOUNTER — Other Ambulatory Visit: Payer: Self-pay | Admitting: Urology

## 2014-04-09 ENCOUNTER — Observation Stay (HOSPITAL_COMMUNITY)
Admission: AD | Admit: 2014-04-09 | Discharge: 2014-04-10 | Disposition: A | Discharge status: Home / Self Care | Payer: 59 | Source: Ambulatory Visit | Attending: Urology | Admitting: Urology

## 2014-04-09 DIAGNOSIS — N23 Unspecified renal colic: Secondary | ICD-10-CM | POA: Diagnosis present

## 2014-04-09 DIAGNOSIS — E7800 Pure hypercholesterolemia, unspecified: Secondary | ICD-10-CM | POA: Insufficient documentation

## 2014-04-09 DIAGNOSIS — I10 Essential (primary) hypertension: Secondary | ICD-10-CM | POA: Insufficient documentation

## 2014-04-09 DIAGNOSIS — N401 Enlarged prostate with lower urinary tract symptoms: Secondary | ICD-10-CM | POA: Insufficient documentation

## 2014-04-09 DIAGNOSIS — N201 Calculus of ureter: Principal | ICD-10-CM | POA: Insufficient documentation

## 2014-04-09 DIAGNOSIS — Z87442 Personal history of urinary calculi: Secondary | ICD-10-CM | POA: Insufficient documentation

## 2014-04-09 DIAGNOSIS — N138 Other obstructive and reflux uropathy: Secondary | ICD-10-CM | POA: Insufficient documentation

## 2014-04-09 DIAGNOSIS — Z79899 Other long term (current) drug therapy: Secondary | ICD-10-CM | POA: Insufficient documentation

## 2014-04-09 LAB — CBC WITH DIFFERENTIAL/PLATELET
Basophils Absolute: 0 K/uL (ref 0.0–0.1)
Basophils Relative: 0 % (ref 0–1)
Eosinophils Absolute: 0.3 K/uL (ref 0.0–0.7)
Eosinophils Relative: 3 % (ref 0–5)
HCT: 37.9 % — ABNORMAL LOW (ref 39.0–52.0)
Hemoglobin: 12.3 g/dL — ABNORMAL LOW (ref 13.0–17.0)
Lymphocytes Relative: 16 % (ref 12–46)
Lymphs Abs: 1.6 K/uL (ref 0.7–4.0)
MCH: 27.6 pg (ref 26.0–34.0)
MCHC: 32.5 g/dL (ref 30.0–36.0)
MCV: 85.2 fL (ref 78.0–100.0)
Monocytes Absolute: 0.7 K/uL (ref 0.1–1.0)
Monocytes Relative: 7 % (ref 3–12)
Neutro Abs: 7 K/uL (ref 1.7–7.7)
Neutrophils Relative %: 73 % (ref 43–77)
Platelets: 174 K/uL (ref 150–400)
RBC: 4.45 MIL/uL (ref 4.22–5.81)
RDW: 14 % (ref 11.5–15.5)
WBC: 9.6 K/uL (ref 4.0–10.5)

## 2014-04-09 LAB — BASIC METABOLIC PANEL
BUN: 20 mg/dL (ref 6–23)
CALCIUM: 9.3 mg/dL (ref 8.4–10.5)
CO2: 28 mEq/L (ref 19–32)
CREATININE: 1.43 mg/dL — AB (ref 0.50–1.35)
Chloride: 103 mEq/L (ref 96–112)
GFR calc non Af Amer: 53 mL/min — ABNORMAL LOW (ref >90)
GFR, EST AFRICAN AMERICAN: 62 mL/min — AB (ref >90)
Glucose, Bld: 125 mg/dL — ABNORMAL HIGH (ref 70–99)
Potassium: 3.9 mEq/L (ref 3.7–5.3)
Sodium: 141 mEq/L (ref 137–147)

## 2014-04-09 LAB — URINALYSIS, ROUTINE W REFLEX MICROSCOPIC
Glucose, UA: NEGATIVE mg/dL
Ketones, ur: NEGATIVE mg/dL
Nitrite: POSITIVE — AB
PH: 5.5 (ref 5.0–8.0)
Protein, ur: 100 mg/dL — AB
Specific Gravity, Urine: 1.027 (ref 1.005–1.030)
UROBILINOGEN UA: 1 mg/dL (ref 0.0–1.0)

## 2014-04-09 LAB — URINE MICROSCOPIC-ADD ON

## 2014-04-09 MED ORDER — HYDROMORPHONE HCL PF 1 MG/ML IJ SOLN
0.5000 mg | INTRAMUSCULAR | Status: DC | PRN
  Administered 2014-04-09 – 2014-04-10 (×5): 1 mg via INTRAVENOUS
  Filled 2014-04-09 (×6): qty 1

## 2014-04-09 MED ORDER — KETOROLAC TROMETHAMINE 30 MG/ML IJ SOLN
30.0000 mg | Freq: Four times a day (QID) | INTRAMUSCULAR | Status: DC
  Administered 2014-04-09 – 2014-04-10 (×3): 30 mg via INTRAVENOUS
  Filled 2014-04-09 (×7): qty 1

## 2014-04-09 MED ORDER — SENNA 8.6 MG PO TABS
1.0000 | ORAL_TABLET | Freq: Two times a day (BID) | ORAL | Status: DC
  Administered 2014-04-09: 8.6 mg via ORAL
  Filled 2014-04-09: qty 1

## 2014-04-09 MED ORDER — DIPHENHYDRAMINE HCL 12.5 MG/5ML PO ELIX: 12.5000 mg | ORAL_SOLUTION | Freq: Four times a day (QID) | ORAL | Status: DC | PRN

## 2014-04-09 MED ORDER — DIPHENHYDRAMINE HCL 50 MG/ML IJ SOLN: 12.5000 mg | Freq: Four times a day (QID) | INTRAMUSCULAR | Status: DC | PRN

## 2014-04-09 MED ORDER — ZOLPIDEM TARTRATE 5 MG PO TABS: 5.0000 mg | ORAL_TABLET | Freq: Every evening | ORAL | Status: DC | PRN

## 2014-04-09 MED ORDER — ONDANSETRON HCL 4 MG/2ML IJ SOLN: 4.0000 mg | INTRAMUSCULAR | Status: DC | PRN

## 2014-04-09 NOTE — H&P (Signed)
Reason For Visit Lt flank pain   Active Problems Problems  1. Flank pain (789.09)   Assessed By: Jethro Bolus (Urology); Last Assessed: 09 Apr 2014  History of Present Illness 58 yo married male presents today having severe Lt flank pain. He has had a Percocet at 10am & 12 noon today, which is not touching his pain. His wife removed his stent yesterday. He is s/p cysto/Lt RPG/laser fragmentation of stone on 04/06/14. He also has an imbedded stone in the Lt UPJ area.    Scott Burch has a history of BPH, ED and kidney stones. He remains on alfuzosin and finasteride for his LUTS. His PCP follows his PSA.    He has been having intermittent left flank pain for almost 2 months. A CT on 03/06/14 showed a 4 mm stone within the left extra renal pelvis as well as several other left non obstructing renal stones. Dr. Patsi Sears offered him ureteroscopy and stone extraction but he was asymptomatic at the time and denied treatment.   Past Medical History Problems  1. History of Arthritis (V13.4) 2. History of Asthma (493.90) 3. History of cardiac disorder (V12.50) 4. History of esophageal reflux (V12.79) 5. History of hypercholesterolemia (V12.29) 6. History of hypertension (V12.59) 7. History of kidney stones (V13.01) 8. History of Murmur (785.2)  Surgical History Problems  1. History of Heart Surgery 2. History of Lithotripsy 3. History of Neck Surgery  Current Meds 1. Alfuzosin HCl ER 10 MG Oral Tablet Extended Release 24 Hour; TAKE 1 TABLET  Bedtime;  Therapy: 05Oct2011 to (Evaluate:13Jun2015)  Requested for: 23Jun2014; Last  Rx:18Jun2014 Ordered 2. Finasteride 5 MG Oral Tablet; TAKE ONE TABLET BY MOUTH DAILY;  Therapy: 30Dec2014 to (Evaluate:25Dec2015)  Requested for: 30Dec2014; Last  Rx:30Dec2014 Ordered 3. Fish Oil CAPS;  Therapy: (Recorded:21Sep2010) to Recorded 4. Hydrocodone-Acetaminophen 5-325 MG Oral Tablet; TAKE 1 TO 2 TABLETS EVERY 4 TO  6 HOURS AS NEEDED;   Therapy: 30Dec2014 to (Evaluate:06Apr2015); Last Rx:02Apr2015 Ordered 5. Lisinopril 20 MG Oral Tablet;  Therapy: 21Dec2010 to Recorded 6. Promethazine HCl - 25 MG Oral Tablet; TAKE 1 TABLET EVERY 6 HOURS AS NEEDED  FOR NAUSEA;  Therapy: 05Feb2015 to (Evaluate:04Apr2015)  Requested for: 30Mar2015; Last  Rx:30Mar2015 Ordered 7. Simvastatin 20 MG Oral Tablet;  Therapy: 21Dec2010 to Recorded 8. Staxyn 10 MG Oral Tablet Dispersible; TAKE 10 MG As Directed;  Therapy: 13Sep2011 to (Last Rx:13Sep2011) Ordered 9. Urocit-K 15 15 MEQ (1620 MG) Oral Tablet Extended Release; TAKE 2 TABLET Twice  daily;  Therapy: 13Oct2010 to (Evaluate:04Apr2013)  Requested for: 06Sep2012; Last  Rx:06Sep2012 Ordered 10. Vitamin D (Ergocalciferol) 50000 UNIT Oral Capsule;   Therapy: (Recorded:30Dec2014) to Recorded  Allergies Medication  1. Iodine SOLN  Family History Problems  1. Family history of Family Health Status Number Of Children   1 daughter 2. No pertinent family history : Mother  Social History Problems  1. Alcohol Use   socially 2. Caffeine Use   2-4 cola 3. Marital History - Currently Married 4. Never A Smoker 5. Occupation:   VP 6. Denied: History of Tobacco Use  Review of Systems Genitourinary, constitutional, skin, eye, otolaryngeal, hematologic/lymphatic, cardiovascular, pulmonary, endocrine, musculoskeletal, gastrointestinal, neurological and psychiatric system(s) were reviewed and pertinent findings if present are noted.  Genitourinary: urinary frequency, feelings of urinary urgency, nocturia, weak urinary stream, urinary stream starts and stops, incomplete emptying of bladder, hematuria and initiating urination requires straining, but no dysuria, no incontinence, no difficulty starting the urinary stream and no post-void dribbling.  Gastrointestinal: flank  pain, abdominal pain and heartburn, but no nausea, no vomiting and no constipation.  Constitutional: no fever, no night  sweats, not feeling tired (fatigue) and no recent weight loss.  Integumentary: no new skin rashes or lesions and no pruritus.  Eyes: no blurred vision and no diplopia.  ENT: no sore throat and no sinus problems.  Hematologic/Lymphatic: no tendency to easily bruise and no swollen glands.  Cardiovascular: no chest pain and no leg swelling.  Respiratory: no shortness of breath and no cough.  Endocrine: no polydipsia.  Musculoskeletal: joint pain, but no back pain.  Neurological: headache, but no dizziness.  Psychiatric: no anxiety and no depression.    Vitals Vital Signs [Data Includes: Last 1 Day]  Recorded: 20Apr2015 03:32PM  Blood Pressure: 150 / 113 Heart Rate: 71  Physical Exam Constitutional: Well nourished and well developed . No acute distress.  ENT:. The ears and nose are normal in appearance.  Neck: The appearance of the neck is normal and no neck mass is present.  Pulmonary: No respiratory distress and normal respiratory rhythm and effort.  Cardiovascular: Heart rate and rhythm are normal . No peripheral edema.  Abdomen: severe left CVA tenderness.  Genitourinary: Examination of the penis demonstrates no discharge, no masses, no lesions and a normal meatus. The scrotum is without lesions. The right epididymis is palpably normal and non-tender. The left epididymis is palpably normal and non-tender. The right testis is non-tender and without masses. The left testis is non-tender and without masses.  Skin: Normal skin turgor, no visible rash and no visible skin lesions.  Neuro/Psych:. Mood and affect are appropriate.    Results/Data Selected Results  [facility] Surgical Images 17Apr2015 12:00AM Jethro Bolus   Test Name Result Flag Reference  [facility] Surgical Images     Diagnostic Images Are Available In PACS For This Exam.   AU CT-STONE PROTOCOL 17Mar2015 12:00AM Jethro Bolus   Test Name Result Flag Reference  CT-STONE PROTOCOL (Report)    ** RADIOLOGY REPORT BY  [facility] **   CLINICAL DATA: Evaluate for renal calculus disease  EXAM: CT ABDOMEN AND PELVIS WITHOUT CONTRAST (URINARY CALCULUS PROTOCOL)  TECHNIQUE: Multidetector CT imaging was performed through the abdomen and pelvis without intravenous contrast to include the urinary tract.  COMPARISON: US RENAL*L* dated 01/25/2014; CT UROGRAM dated 09/10/2009  FINDINGS: The lung bases are unremarkable.  Noncontrast evaluation visualized portions of the liver is unremarkable. The spleen, adrenals, pancreas, right kidney are unremarkable.  Evaluation of the left kidney demonstrates a 4 mm calculus within the dependent portion of a left extrarenal pelvis. Nonobstructing medullary calculi identified within the lower pole ranging in size from 4-3 mm. There is otherwise no evidence there is no evidence of bowel obstruction, enteritis, colitis, diverticulitis within the limitations of a noncontrast CT. There is no evidence of abdominal aortic aneurysm. There is no evidence of abdominal or pelvic masses, free fluid, loculated fluid collections. There is no evidence of appendicitis. The appendix is identified.  The osseous structures demonstrate no evidence of aggressive appearing osseous lesions.  There is no evidence of abdominal wall nor inguinal hernia.  IMPRESSION: 1. 4 mm calculus within the dependent portion of a small left extrarenal pelvis. Nonobstructing medullary calculi identified on the left. There is no evidence of obstructive uropathy. 2. Within the limitations of a noncontrast CT is otherwise no evidence of obstructive or inflammatory abnormalities.   Electronically Signed  By: Salome Holmes M.D.  On: 03/06/2014 16:38   Assessment Assessed  1. Flank  pain (789.09) 2. Nephrolithiasis (592.0) 3. Renal colic on left side (788.0)  Severe L flank pain, 1 day post JJ removal. I have reviewed the intra-operative photos and x-rays with Dr. Berneice Heinrich, who concurs that  he will probably need UPJ excision and repair via laparoscopic approach. We will avoid percutaneous nephrostomy, and therefore, he will be admitted for IV pain med control, and have left JJ stent placement in AM.   Plan Flank pain  1. Administered: Ketorolac Tromethamine 60 MG/2ML Injection Solution 2. Administered: Promethazine HCl 25 MG/ML Injection Solution  Admit for IV pain control. (dilauded); followed by cysto and JJ stent in AM.   Signatures Electronically signed by : Jethro Bolus, M.D.; Apr 09 2014  4:42PM EST  Reason For Visit Lt flank pain   Active Problems Problems  1. Flank pain (789.09)   Assessed By: Jethro Bolus (Urology); Last Assessed: 09 Apr 2014  History of Present Illness 58 yo married male presents today having severe Lt flank pain. He has had a Percocet at 10am & 12 noon today, which is not touching his pain. His wife removed his stent yesterday. He is s/p cysto/Lt RPG/laser fragmentation of stone on 04/06/14. He also has an imbedded stone in the Lt UPJ area.    Scott Burch has a history of BPH, ED and kidney stones. He remains on alfuzosin and finasteride for his LUTS. His PCP follows his PSA.    He has been having intermittent left flank pain for almost 2 months. A CT on 03/06/14 showed a 4 mm stone within the left extra renal pelvis as well as several other left non obstructing renal stones. Dr. Patsi Sears offered him ureteroscopy and stone extraction but he was asymptomatic at the time and denied treatment.   Past Medical History Problems  1. History of Arthritis (V13.4) 2. History of Asthma (493.90) 3. History of cardiac disorder (V12.50) 4. History of esophageal reflux (V12.79) 5. History of hypercholesterolemia (V12.29) 6. History of hypertension (V12.59) 7. History of kidney stones (V13.01) 8. History of Murmur (785.2)  Surgical History Problems  1. History of Heart Surgery 2. History of Lithotripsy 3. History of Neck  Surgery  Current Meds 1. Alfuzosin HCl ER 10 MG Oral Tablet Extended Release 24 Hour; TAKE 1 TABLET  Bedtime;  Therapy: 05Oct2011 to (Evaluate:13Jun2015)  Requested for: 23Jun2014; Last  Rx:18Jun2014 Ordered 2. Finasteride 5 MG Oral Tablet; TAKE ONE TABLET BY MOUTH DAILY;  Therapy: 30Dec2014 to (Evaluate:25Dec2015)  Requested for: 30Dec2014; Last  Rx:30Dec2014 Ordered 3. Fish Oil CAPS;  Therapy: (Recorded:21Sep2010) to Recorded 4. Hydrocodone-Acetaminophen 5-325 MG Oral Tablet; TAKE 1 TO 2 TABLETS EVERY 4 TO  6 HOURS AS NEEDED;  Therapy: 30Dec2014 to (Evaluate:06Apr2015); Last Rx:02Apr2015 Ordered 5. Lisinopril 20 MG Oral Tablet;  Therapy: 21Dec2010 to Recorded 6. Promethazine HCl - 25 MG Oral Tablet; TAKE 1 TABLET EVERY 6 HOURS AS NEEDED  FOR NAUSEA;  Therapy: 05Feb2015 to (Evaluate:04Apr2015)  Requested for: 30Mar2015; Last  Rx:30Mar2015 Ordered 7. Simvastatin 20 MG Oral Tablet;  Therapy: 21Dec2010 to Recorded 8. Staxyn 10 MG Oral Tablet Dispersible; TAKE 10 MG As Directed;  Therapy: 13Sep2011 to (Last Rx:13Sep2011) Ordered 9. Urocit-K 15 15 MEQ (1620 MG) Oral Tablet Extended Release; TAKE 2 TABLET Twice  daily;  Therapy: 13Oct2010 to (Evaluate:04Apr2013)  Requested for: 06Sep2012; Last  Rx:06Sep2012 Ordered 10. Vitamin D (Ergocalciferol) 50000 UNIT Oral Capsule;   Therapy: (Recorded:30Dec2014) to Recorded  Allergies Medication  1. Iodine SOLN  Family History Problems  1. Family history of Family Health Status  Number Of Children   1 daughter 2. No pertinent family history : Mother  Social History Problems  1. Alcohol Use   socially 2. Caffeine Use   2-4 cola 3. Marital History - Currently Married 4. Never A Smoker 5. Occupation:   VP 6. 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  Genitourinary: urinary frequency, feelings of urinary urgency, nocturia, weak urinary stream, urinary stream starts and stops, incomplete emptying of bladder, hematuria and initiating urination requires straining, but no dysuria, no incontinence, no difficulty starting the urinary stream and no post-void dribbling.  Gastrointestinal: flank pain, abdominal pain and heartburn, but no nausea, no vomiting and no constipation.  Constitutional: no fever, no night sweats, not feeling tired (fatigue) and no recent weight loss.  Integumentary: no new skin rashes or lesions and no pruritus.  Eyes: no blurred vision and no diplopia.  ENT: no sore throat and no sinus problems.  Hematologic/Lymphatic: no tendency to easily bruise and no swollen glands.  Cardiovascular: no chest pain and no leg swelling.  Respiratory: no shortness of breath and no cough.  Endocrine: no polydipsia.  Musculoskeletal: joint pain, but no back pain.  Neurological: headache, but no dizziness.  Psychiatric: no anxiety and no depression.    Vitals Vital Signs [Data Includes: Last 1 Day]  Recorded: 20Apr2015 03:32PM  Blood Pressure: 150 / 113 Heart Rate: 71  Physical Exam Constitutional: Well nourished and well developed . No acute distress.  ENT:. The ears and nose are normal in appearance.  Neck: The appearance of the neck is normal and no neck mass is present.  Pulmonary: No respiratory distress and normal respiratory rhythm and effort.  Cardiovascular: Heart rate and rhythm are normal . No peripheral edema.  Abdomen: severe left CVA tenderness.  Genitourinary: Examination of the penis demonstrates no discharge, no masses, no lesions and a normal meatus. The scrotum is without lesions. The right epididymis is palpably normal and non-tender. The left epididymis is palpably normal and non-tender. The right testis is non-tender and without masses.  The left testis is non-tender and without masses.  Skin: Normal skin turgor, no visible rash and no visible skin lesions.  Neuro/Psych:. Mood and affect are appropriate.    Results/Data Selected Results  [facility] 17Apr2015 12:00AM Jethro Bolusannenbaum, Kelso Bibby   Test Name Result Flag Reference  [facility] Surgical Images     Diagnostic Images Are Available In PACS For This Exam.   AU CT-STONE PROTOCOL 17Mar2015 12:00AM Jethro Bolusannenbaum, Mayleigh Tetrault   Test Name Result Flag Reference  CT-STONE PROTOCOL (Report)    ** RADIOLOGY REPORT BY [facility] **   CLINICAL DATA: Evaluate for renal calculus disease  EXAM: CT ABDOMEN AND PELVIS WITHOUT CONTRAST (URINARY CALCULUS PROTOCOL)  TECHNIQUE: Multidetector CT imaging was performed through the abdomen and pelvis without intravenous contrast to include the urinary tract.  COMPARISON: US RENAL*L* dated 01/25/2014; CT UROGRAM dated 09/10/2009  FINDINGS: The lung bases are unremarkable.  Noncontrast evaluation visualized portions of the liver is unremarkable. The spleen, adrenals, pancreas, right kidney are unremarkable.  Evaluation of the left kidney demonstrates a 4 mm calculus within the dependent portion of a left extrarenal pelvis. Nonobstructing medullary calculi identified within the lower pole ranging in size from 4-3 mm. There is otherwise no evidence there is no evidence of bowel obstruction, enteritis, colitis, diverticulitis within the limitations  of a noncontrast CT. There is no evidence of abdominal aortic aneurysm. There is no evidence of abdominal or pelvic masses, free fluid, loculated fluid collections. There is no evidence of appendicitis. The appendix is identified.  The osseous structures demonstrate no evidence of aggressive appearing osseous lesions.  There is no evidence of abdominal wall nor inguinal hernia.  IMPRESSION: 1. 4 mm calculus within the dependent portion of a small left extrarenal pelvis.  Nonobstructing medullary calculi identified on the left. There is no evidence of obstructive uropathy. 2. Within the limitations of a noncontrast CT is otherwise no evidence of obstructive or inflammatory abnormalities.   Electronically Signed  By: Salome HolmesHector Cooper M.D.  On: 03/06/2014 16:38   Assessment Assessed  1. Flank pain (789.09) 2. Nephrolithiasis (592.0) 3. Renal colic on left side (788.0)  Severe L flank pain, 1 day post JJ removal. I have reviewed the intra-operative photos and x-rays with Dr. Berneice HeinrichManny, who concurs that he will probably need UPJ excision and repair via laparoscopic approach. We will avoid percutaneous nephrostomy, and therefore, he will be admitted for IV pain med control, and have left JJ stent placement in AM.   Plan Flank pain  1. Administered: Ketorolac Tromethamine 60 MG/2ML Injection Solution 2. Administered: Promethazine HCl 25 MG/ML Injection Solution  Admit for IV pain control. (dilauded); followed by cysto and JJ stent in AM.   Signatures Electronically signed by : Jethro BolusSigmund Jedediah Noda, M.D.; Apr 09 2014  4:42PM EST

## 2014-04-10 ENCOUNTER — Encounter (HOSPITAL_COMMUNITY)
Admission: AD | Disposition: A | Discharge status: Home / Self Care | Payer: Self-pay | Source: Ambulatory Visit | Attending: Urology

## 2014-04-10 ENCOUNTER — Observation Stay (HOSPITAL_COMMUNITY): Payer: 59 | Admitting: Registered Nurse

## 2014-04-10 ENCOUNTER — Encounter (HOSPITAL_COMMUNITY): Payer: 59 | Admitting: Registered Nurse

## 2014-04-10 HISTORY — PX: CYSTOSCOPY WITH RETROGRADE PYELOGRAM, URETEROSCOPY AND STENT PLACEMENT: SHX5789

## 2014-04-10 SURGERY — CYSTOURETEROSCOPY, WITH RETROGRADE PYELOGRAM AND STENT INSERTION
Anesthesia: General | Laterality: Left

## 2014-04-10 MED ORDER — PROMETHAZINE HCL 25 MG/ML IJ SOLN: 6.2500 mg | INTRAMUSCULAR | Status: DC | PRN

## 2014-04-10 MED ORDER — LACTATED RINGERS IV SOLN: INTRAVENOUS | Status: DC

## 2014-04-10 MED ORDER — OXYCODONE-ACETAMINOPHEN 5-325 MG PO TABS: 1.0000 | ORAL_TABLET | Freq: Four times a day (QID) | ORAL | Status: DC | PRN

## 2014-04-10 MED ORDER — LIDOCAINE HCL (CARDIAC) 20 MG/ML IV SOLN
INTRAVENOUS | Status: AC
  Filled 2014-04-10: qty 5

## 2014-04-10 MED ORDER — DEXAMETHASONE SODIUM PHOSPHATE 4 MG/ML IJ SOLN
INTRAMUSCULAR | Status: DC | PRN
  Administered 2014-04-10: 10 mg via INTRAVENOUS

## 2014-04-10 MED ORDER — MEPERIDINE HCL 50 MG/ML IJ SOLN
6.2500 mg | INTRAMUSCULAR | Status: DC | PRN
Start: 2014-04-10 — End: 2014-04-10

## 2014-04-10 MED ORDER — TRIMETHOPRIM 100 MG PO TABS: 100.0000 mg | ORAL_TABLET | ORAL | Status: DC

## 2014-04-10 MED ORDER — PROPOFOL 10 MG/ML IV BOLUS
INTRAVENOUS | Status: DC | PRN
  Administered 2014-04-10: 170 mg via INTRAVENOUS

## 2014-04-10 MED ORDER — CEFAZOLIN SODIUM-DEXTROSE 2-3 GM-% IV SOLR: 2.0000 g | Freq: Once | INTRAVENOUS | Status: DC

## 2014-04-10 MED ORDER — ONDANSETRON HCL 4 MG/2ML IJ SOLN
INTRAMUSCULAR | Status: DC | PRN
  Administered 2014-04-10 (×2): 2 mg via INTRAVENOUS

## 2014-04-10 MED ORDER — FENTANYL CITRATE 0.05 MG/ML IJ SOLN
INTRAMUSCULAR | Status: DC | PRN
  Administered 2014-04-10: 100 ug via INTRAVENOUS

## 2014-04-10 MED ORDER — IOHEXOL 300 MG/ML  SOLN
INTRAMUSCULAR | Status: DC | PRN
  Administered 2014-04-10: 10 mL

## 2014-04-10 MED ORDER — BELLADONNA ALKALOIDS-OPIUM 16.2-60 MG RE SUPP
RECTAL | Status: DC | PRN
  Administered 2014-04-10: 1 via RECTAL

## 2014-04-10 MED ORDER — FENTANYL CITRATE 0.05 MG/ML IJ SOLN: 25.0000 ug | INTRAMUSCULAR | Status: DC | PRN

## 2014-04-10 MED ORDER — LACTATED RINGERS IV SOLN
INTRAVENOUS | Status: DC | PRN
  Administered 2014-04-10: 10:00:00 via INTRAVENOUS

## 2014-04-10 MED ORDER — LACTATED RINGERS IV SOLN
INTRAVENOUS | Status: DC
  Administered 2014-04-10: 1000 mL via INTRAVENOUS

## 2014-04-10 MED ORDER — PROPOFOL 10 MG/ML IV BOLUS
INTRAVENOUS | Status: AC
  Filled 2014-04-10: qty 20

## 2014-04-10 MED ORDER — FENTANYL CITRATE 0.05 MG/ML IJ SOLN
INTRAMUSCULAR | Status: AC
  Filled 2014-04-10: qty 5

## 2014-04-10 MED ORDER — CEFAZOLIN SODIUM-DEXTROSE 2-3 GM-% IV SOLR
INTRAVENOUS | Status: AC
  Filled 2014-04-10: qty 50

## 2014-04-10 MED ORDER — BELLADONNA ALKALOIDS-OPIUM 16.2-60 MG RE SUPP
RECTAL | Status: AC
  Filled 2014-04-10: qty 1

## 2014-04-10 MED ORDER — LIDOCAINE HCL 2 % EX GEL
CUTANEOUS | Status: AC
  Filled 2014-04-10: qty 10

## 2014-04-10 MED ORDER — CEFAZOLIN SODIUM-DEXTROSE 2-3 GM-% IV SOLR
INTRAVENOUS | Status: DC | PRN
  Administered 2014-04-10: 2 g via INTRAVENOUS

## 2014-04-10 MED ORDER — STERILE WATER FOR IRRIGATION IR SOLN
Status: DC | PRN
  Administered 2014-04-10: 3000 mL via INTRAVESICAL

## 2014-04-10 MED ORDER — ONDANSETRON HCL 8 MG PO TABS: 8.0000 mg | ORAL_TABLET | Freq: Three times a day (TID) | ORAL | Status: DC | PRN

## 2014-04-10 MED ORDER — MIDAZOLAM HCL 5 MG/5ML IJ SOLN
INTRAMUSCULAR | Status: DC | PRN
  Administered 2014-04-10: 2 mg via INTRAVENOUS
  Administered 2014-04-10: 1.5 mg via INTRAVENOUS

## 2014-04-10 MED ORDER — MIDAZOLAM HCL 2 MG/2ML IJ SOLN
INTRAMUSCULAR | Status: AC
  Filled 2014-04-10: qty 2

## 2014-04-10 MED ORDER — ACETAMINOPHEN 10 MG/ML IV SOLN
1000.0000 mg | Freq: Once | INTRAVENOUS | Status: DC
  Filled 2014-04-10: qty 100

## 2014-04-10 MED ORDER — URIBEL 118 MG PO CAPS
1.0000 | ORAL_CAPSULE | Freq: Two times a day (BID) | ORAL | Status: DC | PRN
Start: 2014-04-10 — End: 2014-04-25

## 2014-04-10 MED ORDER — ONDANSETRON HCL 4 MG/2ML IJ SOLN
INTRAMUSCULAR | Status: AC
  Filled 2014-04-10: qty 2

## 2014-04-10 MED ORDER — LIDOCAINE HCL (CARDIAC) 10 MG/ML IV SOLN
INTRAVENOUS | Status: DC | PRN
  Administered 2014-04-10: 100 mg via INTRAVENOUS

## 2014-04-10 MED ORDER — DEXAMETHASONE SODIUM PHOSPHATE 10 MG/ML IJ SOLN
INTRAMUSCULAR | Status: AC
  Filled 2014-04-10: qty 1

## 2014-04-10 SURGICAL SUPPLY — 13 items
BAG URO CATCHER STRL LF (DRAPE) ×2 IMPLANT
CATH INTERMIT  6FR 70CM (CATHETERS) ×2 IMPLANT
CLOTH BEACON ORANGE TIMEOUT ST (SAFETY) ×2 IMPLANT
DRAPE CAMERA CLOSED 9X96 (DRAPES) ×2 IMPLANT
GLOVE BIOGEL M STRL SZ7.5 (GLOVE) ×2 IMPLANT
GOWN STRL REUS W/TWL XL LVL3 (GOWN DISPOSABLE) ×2 IMPLANT
GUIDEWIRE STR DUAL SENSOR (WIRE) ×2 IMPLANT
MANIFOLD NEPTUNE II (INSTRUMENTS) ×2 IMPLANT
NS IRRIG 1000ML POUR BTL (IV SOLUTION) ×2 IMPLANT
PACK CYSTO (CUSTOM PROCEDURE TRAY) ×2 IMPLANT
SCRUB PCMX 4 OZ (MISCELLANEOUS) ×2 IMPLANT
STENT POLARIS 5FRX24 (STENTS) ×2 IMPLANT
TUBING CONNECTING 10 (TUBING) ×2 IMPLANT

## 2014-04-10 NOTE — Interval H&P Note (Signed)
History and Physical Interval Note:  04/10/2014 10:37 AM  Scott Burch  has presented today for surgery, with the diagnosis of LEFT URETHRAL STONE  The various methods of treatment have been discussed with the patient and family. After consideration of risks, benefits and other options for treatment, the patient has consented to  Procedure(s) with comments: CYSTOSCOPY WITH RETROGRADE PYELOGRAM, URETEROSCOPY AND DOUBLE J STENT PLACEMENT (Left) - REQUEST 30 MINS PER PAM. as a surgical intervention .  The patient's history has been reviewed, patient examined, no change in status, stable for surgery.  I have reviewed the patient's chart and labs.  Questions were answered to the patient's satisfaction.     Kathi LudwigSigmund I Johne Buckle

## 2014-04-10 NOTE — Anesthesia Preprocedure Evaluation (Signed)
Anesthesia Evaluation  Patient identified by MRN, date of birth, ID band Patient awake    Reviewed: Allergy & Precautions, H&P , NPO status , Patient's Chart, lab work & pertinent test results  Airway Mallampati: II TM Distance: >3 FB Neck ROM: Full    Dental  (+) Dental Advisory Given   Pulmonary neg pulmonary ROS,  breath sounds clear to auscultation        Cardiovascular hypertension, Pt. on medications + dysrhythmias Rhythm:Regular Rate:Normal     Neuro/Psych negative neurological ROS  negative psych ROS   GI/Hepatic negative GI ROS, Neg liver ROS,   Endo/Other  negative endocrine ROS  Renal/GU Renal diseasenegative Renal ROS     Musculoskeletal negative musculoskeletal ROS (+)   Abdominal   Peds  Hematology negative hematology ROS (+)   Anesthesia Other Findings   Reproductive/Obstetrics                           Anesthesia Physical Anesthesia Plan  ASA: II  Anesthesia Plan: General   Post-op Pain Management:    Induction: Intravenous  Airway Management Planned: LMA  Additional Equipment:   Intra-op Plan:   Post-operative Plan: Extubation in OR  Informed Consent: I have reviewed the patients History and Physical, chart, labs and discussed the procedure including the risks, benefits and alternatives for the proposed anesthesia with the patient or authorized representative who has indicated his/her understanding and acceptance.   Dental advisory given  Plan Discussed with: CRNA  Anesthesia Plan Comments:         Anesthesia Quick Evaluation  

## 2014-04-10 NOTE — Discharge Instructions (Signed)
Kidney Stones  Kidney stones (urolithiasis) are deposits that form inside your kidneys. The intense pain is caused by the stone moving through the urinary tract. When the stone moves, the ureter goes into spasm around the stone. The stone is usually passed in the urine.   CAUSES   · A disorder that makes certain neck glands produce too much parathyroid hormone (primary hyperparathyroidism).  · A buildup of uric acid crystals, similar to gout in your joints.  · Narrowing (stricture) of the ureter.  · A kidney obstruction present at birth (congenital obstruction).  · Previous surgery on the kidney or ureters.  · Numerous kidney infections.  SYMPTOMS   · Feeling sick to your stomach (nauseous).  · Throwing up (vomiting).  · Blood in the urine (hematuria).  · Pain that usually spreads (radiates) to the groin.  · Frequency or urgency of urination.  DIAGNOSIS   · Taking a history and physical exam.  · Blood or urine tests.  · CT scan.  · Occasionally, an examination of the inside of the urinary bladder (cystoscopy) is performed.  TREATMENT   · Observation.  · Increasing your fluid intake.  · Extracorporeal shock wave lithotripsy This is a noninvasive procedure that uses shock waves to break up kidney stones.  · Surgery may be needed if you have severe pain or persistent obstruction. There are various surgical procedures. Most of the procedures are performed with the use of small instruments. Only small incisions are needed to accommodate these instruments, so recovery time is minimized.  The size, location, and chemical composition are all important variables that will determine the proper choice of action for you. Talk to your health care provider to better understand your situation so that you will minimize the risk of injury to yourself and your kidney.   HOME CARE INSTRUCTIONS   · Drink enough water and fluids to keep your urine clear or pale yellow. This will help you to pass the stone or stone fragments.  · Strain  all urine through the provided strainer. Keep all particulate matter and stones for your health care provider to see. The stone causing the pain may be as small as a grain of salt. It is very important to use the strainer each and every time you pass your urine. The collection of your stone will allow your health care provider to analyze it and verify that a stone has actually passed. The stone analysis will often identify what you can do to reduce the incidence of recurrences.  · Only take over-the-counter or prescription medicines for pain, discomfort, or fever as directed by your health care provider.  · Make a follow-up appointment with your health care provider as directed.  · Get follow-up X-rays if required. The absence of pain does not always mean that the stone has passed. It may have only stopped moving. If the urine remains completely obstructed, it can cause loss of kidney function or even complete destruction of the kidney. It is your responsibility to make sure X-rays and follow-ups are completed. Ultrasounds of the kidney can show blockages and the status of the kidney. Ultrasounds are not associated with any radiation and can be performed easily in a matter of minutes.  SEEK MEDICAL CARE IF:  · You experience pain that is progressive and unresponsive to any pain medicine you have been prescribed.  SEEK IMMEDIATE MEDICAL CARE IF:   · Pain cannot be controlled with the prescribed medicine.  · You have a fever   or shaking chills.  · The severity or intensity of pain increases over 18 hours and is not relieved by pain medicine.  · You develop a new onset of abdominal pain.  · You feel faint or pass out.  · You are unable to urinate.  MAKE SURE YOU:   · Understand these instructions.  · Will watch your condition.  · Will get help right away if you are not doing well or get worse.  Document Released: 12/07/2005 Document Revised: 08/09/2013 Document Reviewed: 05/10/2013  [employer]® Patient Information ©2014  [employer].

## 2014-04-10 NOTE — H&P (Signed)
Active Problems  Problems  1. Flank pain (789.09)   Assessed By: Jethro Bolusannenbaum, Adamari Frede (Urology); Last Assessed: 09 Apr 2014  History of Present Illness  58 yo married male presents today having severe Lt flank pain. He has had a Percocet at 10am & 12 noon today, which is not touching his pain. His wife removed his stent yesterday. He is s/p cysto/Lt RPG/laser fragmentation of stone on 04/06/14. He also has an imbedded stone in the Lt UPJ area.  Mr. Orson SlickBowman has a history of BPH, ED and kidney stones. He remains on alfuzosin and finasteride for his LUTS. His PCP follows his PSA.  He has been having intermittent left flank pain for almost 2 months. A CT on 03/06/14 showed a 4 mm stone within the left extra renal pelvis as well as several other left non obstructing renal stones. Dr. Patsi Searsannenbaum offered him ureteroscopy and stone extraction but he was asymptomatic at the time and denied treatment.  Past Medical History  Problems  1. History of Arthritis (V13.4)  2. History of Asthma (493.90)  3. History of cardiac disorder (V12.50)  4. History of esophageal reflux (V12.79)  5. History of hypercholesterolemia (V12.29)  6. History of hypertension (V12.59)  7. History of kidney stones (V13.01)  8. History of Murmur (785.2)  Surgical History  Problems  1. History of Heart Surgery  2. History of Lithotripsy  3. History of Neck Surgery  Current Meds  1. Alfuzosin HCl ER 10 MG Oral Tablet Extended Release 24 Hour; TAKE 1 TABLET  Bedtime;  Therapy: 05Oct2011 to (Evaluate:13Jun2015) Requested for: 23Jun2014; Last  Rx:18Jun2014 Ordered  2. Finasteride 5 MG Oral Tablet; TAKE ONE TABLET BY MOUTH DAILY;  Therapy: 30Dec2014 to (Evaluate:25Dec2015) Requested for: 30Dec2014; Last  Rx:30Dec2014 Ordered  3. Fish Oil CAPS;  Therapy: (Recorded:21Sep2010) to Recorded  4. Hydrocodone-Acetaminophen 5-325 MG Oral Tablet; TAKE 1 TO 2 TABLETS EVERY 4 TO  6 HOURS AS NEEDED;  Therapy: 30Dec2014 to  (Evaluate:06Apr2015); Last Rx:02Apr2015 Ordered  5. Lisinopril 20 MG Oral Tablet;  Therapy: 21Dec2010 to Recorded  6. Promethazine HCl - 25 MG Oral Tablet; TAKE 1 TABLET EVERY 6 HOURS AS NEEDED  FOR NAUSEA;  Therapy: 05Feb2015 to (Evaluate:04Apr2015) Requested for: 30Mar2015; Last  Rx:30Mar2015 Ordered  7. Simvastatin 20 MG Oral Tablet;  Therapy: 21Dec2010 to Recorded  8. Staxyn 10 MG Oral Tablet Dispersible; TAKE 10 MG As Directed;  Therapy: 13Sep2011 to (Last Rx:13Sep2011) Ordered  9. Urocit-K 15 15 MEQ (1620 MG) Oral Tablet Extended Release; TAKE 2 TABLET Twice  daily;  Therapy: 13Oct2010 to (Evaluate:04Apr2013) Requested for: 06Sep2012; Last  Rx:06Sep2012 Ordered  10. Vitamin D (Ergocalciferol) 50000 UNIT Oral Capsule;  Therapy: (Recorded:30Dec2014) to Recorded  Allergies  Medication  1. Iodine SOLN  Family History  Problems  1. Family history of Family Health Status Number Of Children   1 daughter  2. No pertinent family history : Mother  Social History  Problems  1. Alcohol Use   socially  2. Caffeine Use   2-4 cola  3. Marital History - Currently Married  4. Never A Smoker  5. Occupation:   VP  6. Denied: History of Tobacco Use  Review of Systems  No changes in pts bowel habits, neurological changes, or progressive lower urinary tract symptoms.  Physical Exam  Constitutional: Well nourished and well developed.  Pulmonary: No respiratory distress and normal respiratory rhythm and effort.  Cardiovascular: Heart rate and rhythm are normal . No obvious murmurs are appreciated.  Abdomen: No CVA tenderness.  Results/Data  The following images/tracing/specimen were independently visualized: .  CT scan from 3/17 with left UPJ stone and several others in the left lower pole.  Discussion/Summary  I again discussed the treatment options with the patient including continued medical management and ureteroscopy. It is unlikely that the patient is going to pass it stone  located in the UPJ, and ureteroscopy was allow us to also clean out the rest of the kidney. One option we discussed was to have a stent placed this afternoon which would ensure the patient did not develop renal colic again, and would allow for ureteral dilation and making ureteroscopy much easier and better chance of being stone free after the procedure. I discussed the risks and benefits of the operation in detail. The patient understands that he again will likely need a stent postoperatively for 5-7 days. I offered to perform this procedure for the patient or book the procedure with Dr. Cassell Smilesanenbaum.   Routing History...      Date/Time From To Method    04/06/2014 12:20 PM Kathi LudwigSigmund I Judianne Seiple, MD Kathi LudwigSigmund I Josyah Achor, MD Fax    04/06/2014 12:20 PM Kathi LudwigSigmund I Anijah Spohr, MD Barbette ReichmannVishwanath Hande, MD Fax

## 2014-04-10 NOTE — Anesthesia Postprocedure Evaluation (Signed)
  Anesthesia Post-op Note  Patient: Scott Burch  Procedure(s) Performed: Procedure(s) (LRB): CYSTOSCOPY WITH RETROGRADE PYELOGRAM, URETEROSCOPY AND DOUBLE J STENT PLACEMENT (Left)  Patient Location: PACU  Anesthesia Type: General  Level of Consciousness: awake and alert   Airway and Oxygen Therapy: Patient Spontanous Breathing  Post-op Pain: mild  Post-op Assessment: Post-op Vital signs reviewed, Patient's Cardiovascular Status Stable, Respiratory Function Stable, Patent Airway and No signs of Nausea or vomiting  Last Vitals:  Filed Vitals:   04/10/14 1235  BP: 134/76  Pulse: 56  Temp: 36.7 C  Resp: 16    Post-op Vital Signs: stable   Complications: No apparent anesthesia complications

## 2014-04-10 NOTE — Addendum Note (Signed)
Addendum created 04/10/14 1806 by Valeda Malmebecca S Katrena Stehlin, CRNA   Modules edited: Orders

## 2014-04-10 NOTE — H&P (View-Only) (Signed)
Reason For Visit Lt flank pain   Active Problems Problems  1. Flank pain (789.09)   Assessed By: Jethro Bolus (Urology); Last Assessed: 09 Apr 2014  History of Present Illness 58 yo married male presents today having severe Lt flank pain. He has had a Percocet at 10am & 12 noon today, which is not touching his pain. His wife removed his stent yesterday. He is s/p cysto/Lt RPG/laser fragmentation of stone on 04/06/14. He also has an imbedded stone in the Lt UPJ area.    Mr. Cudmore has a history of BPH, ED and kidney stones. He remains on alfuzosin and finasteride for his LUTS. His PCP follows his PSA.    He has been having intermittent left flank pain for almost 2 months. A CT on 03/06/14 showed a 4 mm stone within the left extra renal pelvis as well as several other left non obstructing renal stones. Dr. Patsi Sears offered him ureteroscopy and stone extraction but he was asymptomatic at the time and denied treatment.   Past Medical History Problems  1. History of Arthritis (V13.4) 2. History of Asthma (493.90) 3. History of cardiac disorder (V12.50) 4. History of esophageal reflux (V12.79) 5. History of hypercholesterolemia (V12.29) 6. History of hypertension (V12.59) 7. History of kidney stones (V13.01) 8. History of Murmur (785.2)  Surgical History Problems  1. History of Heart Surgery 2. History of Lithotripsy 3. History of Neck Surgery  Current Meds 1. Alfuzosin HCl ER 10 MG Oral Tablet Extended Release 24 Hour; TAKE 1 TABLET  Bedtime;  Therapy: 05Oct2011 to (Evaluate:13Jun2015)  Requested for: 23Jun2014; Last  Rx:18Jun2014 Ordered 2. Finasteride 5 MG Oral Tablet; TAKE ONE TABLET BY MOUTH DAILY;  Therapy: 30Dec2014 to (Evaluate:25Dec2015)  Requested for: 30Dec2014; Last  Rx:30Dec2014 Ordered 3. Fish Oil CAPS;  Therapy: (Recorded:21Sep2010) to Recorded 4. Hydrocodone-Acetaminophen 5-325 MG Oral Tablet; TAKE 1 TO 2 TABLETS EVERY 4 TO  6 HOURS AS NEEDED;   Therapy: 30Dec2014 to (Evaluate:06Apr2015); Last Rx:02Apr2015 Ordered 5. Lisinopril 20 MG Oral Tablet;  Therapy: 21Dec2010 to Recorded 6. Promethazine HCl - 25 MG Oral Tablet; TAKE 1 TABLET EVERY 6 HOURS AS NEEDED  FOR NAUSEA;  Therapy: 05Feb2015 to (Evaluate:04Apr2015)  Requested for: 30Mar2015; Last  Rx:30Mar2015 Ordered 7. Simvastatin 20 MG Oral Tablet;  Therapy: 21Dec2010 to Recorded 8. Staxyn 10 MG Oral Tablet Dispersible; TAKE 10 MG As Directed;  Therapy: 13Sep2011 to (Last Rx:13Sep2011) Ordered 9. Urocit-K 15 15 MEQ (1620 MG) Oral Tablet Extended Release; TAKE 2 TABLET Twice  daily;  Therapy: 13Oct2010 to (Evaluate:04Apr2013)  Requested for: 06Sep2012; Last  Rx:06Sep2012 Ordered 10. Vitamin D (Ergocalciferol) 50000 UNIT Oral Capsule;   Therapy: (Recorded:30Dec2014) to Recorded  Allergies Medication  1. Iodine SOLN  Family History Problems  1. Family history of Family Health Status Number Of Children   1 daughter 2. No pertinent family history : Mother  Social History Problems  1. Alcohol Use   socially 2. Caffeine Use   2-4 cola 3. Marital History - Currently Married 4. Never A Smoker 5. Occupation:   VP 6. Denied: History of Tobacco Use  Review of Systems Genitourinary, constitutional, skin, eye, otolaryngeal, hematologic/lymphatic, cardiovascular, pulmonary, endocrine, musculoskeletal, gastrointestinal, neurological and psychiatric system(s) were reviewed and pertinent findings if present are noted.  Genitourinary: urinary frequency, feelings of urinary urgency, nocturia, weak urinary stream, urinary stream starts and stops, incomplete emptying of bladder, hematuria and initiating urination requires straining, but no dysuria, no incontinence, no difficulty starting the urinary stream and no post-void dribbling.  Gastrointestinal: flank  pain, abdominal pain and heartburn, but no nausea, no vomiting and no constipation.  Constitutional: no fever, no night  sweats, not feeling tired (fatigue) and no recent weight loss.  Integumentary: no new skin rashes or lesions and no pruritus.  Eyes: no blurred vision and no diplopia.  ENT: no sore throat and no sinus problems.  Hematologic/Lymphatic: no tendency to easily bruise and no swollen glands.  Cardiovascular: no chest pain and no leg swelling.  Respiratory: no shortness of breath and no cough.  Endocrine: no polydipsia.  Musculoskeletal: joint pain, but no back pain.  Neurological: headache, but no dizziness.  Psychiatric: no anxiety and no depression.    Vitals Vital Signs [Data Includes: Last 1 Day]  Recorded: 20Apr2015 03:32PM  Blood Pressure: 150 / 113 Heart Rate: 71  Physical Exam Constitutional: Well nourished and well developed . No acute distress.  ENT:. The ears and nose are normal in appearance.  Neck: The appearance of the neck is normal and no neck mass is present.  Pulmonary: No respiratory distress and normal respiratory rhythm and effort.  Cardiovascular: Heart rate and rhythm are normal . No peripheral edema.  Abdomen: severe left CVA tenderness.  Genitourinary: Examination of the penis demonstrates no discharge, no masses, no lesions and a normal meatus. The scrotum is without lesions. The right epididymis is palpably normal and non-tender. The left epididymis is palpably normal and non-tender. The right testis is non-tender and without masses. The left testis is non-tender and without masses.  Skin: Normal skin turgor, no visible rash and no visible skin lesions.  Neuro/Psych:. Mood and affect are appropriate.    Results/Data Selected Results  [facility] Surgical Images 17Apr2015 12:00AM Jethro Bolus   Test Name Result Flag Reference  [facility] Surgical Images     Diagnostic Images Are Available In PACS For This Exam.   AU CT-STONE PROTOCOL 17Mar2015 12:00AM Jethro Bolus   Test Name Result Flag Reference  CT-STONE PROTOCOL (Report)    ** RADIOLOGY REPORT BY  [facility] **   CLINICAL DATA: Evaluate for renal calculus disease  EXAM: CT ABDOMEN AND PELVIS WITHOUT CONTRAST (URINARY CALCULUS PROTOCOL)  TECHNIQUE: Multidetector CT imaging was performed through the abdomen and pelvis without intravenous contrast to include the urinary tract.  COMPARISON: US RENAL*L* dated 01/25/2014; CT UROGRAM dated 09/10/2009  FINDINGS: The lung bases are unremarkable.  Noncontrast evaluation visualized portions of the liver is unremarkable. The spleen, adrenals, pancreas, right kidney are unremarkable.  Evaluation of the left kidney demonstrates a 4 mm calculus within the dependent portion of a left extrarenal pelvis. Nonobstructing medullary calculi identified within the lower pole ranging in size from 4-3 mm. There is otherwise no evidence there is no evidence of bowel obstruction, enteritis, colitis, diverticulitis within the limitations of a noncontrast CT. There is no evidence of abdominal aortic aneurysm. There is no evidence of abdominal or pelvic masses, free fluid, loculated fluid collections. There is no evidence of appendicitis. The appendix is identified.  The osseous structures demonstrate no evidence of aggressive appearing osseous lesions.  There is no evidence of abdominal wall nor inguinal hernia.  IMPRESSION: 1. 4 mm calculus within the dependent portion of a small left extrarenal pelvis. Nonobstructing medullary calculi identified on the left. There is no evidence of obstructive uropathy. 2. Within the limitations of a noncontrast CT is otherwise no evidence of obstructive or inflammatory abnormalities.   Electronically Signed  By: Salome Holmes M.D.  On: 03/06/2014 16:38   Assessment Assessed  1. Flank  pain (789.09) 2. Nephrolithiasis (592.0) 3. Renal colic on left side (788.0)  Severe L flank pain, 1 day post JJ removal. I have reviewed the intra-operative photos and x-rays with Dr. Berneice Heinrich, who concurs that  he will probably need UPJ excision and repair via laparoscopic approach. We will avoid percutaneous nephrostomy, and therefore, he will be admitted for IV pain med control, and have left JJ stent placement in AM.   Plan Flank pain  1. Administered: Ketorolac Tromethamine 60 MG/2ML Injection Solution 2. Administered: Promethazine HCl 25 MG/ML Injection Solution  Admit for IV pain control. (dilauded); followed by cysto and JJ stent in AM.   Signatures Electronically signed by : Jethro Bolus, M.D.; Apr 09 2014  4:42PM EST  Reason For Visit Lt flank pain   Active Problems Problems  1. Flank pain (789.09)   Assessed By: Jethro Bolus (Urology); Last Assessed: 09 Apr 2014  History of Present Illness 58 yo married male presents today having severe Lt flank pain. He has had a Percocet at 10am & 12 noon today, which is not touching his pain. His wife removed his stent yesterday. He is s/p cysto/Lt RPG/laser fragmentation of stone on 04/06/14. He also has an imbedded stone in the Lt UPJ area.    Mr. Selley has a history of BPH, ED and kidney stones. He remains on alfuzosin and finasteride for his LUTS. His PCP follows his PSA.    He has been having intermittent left flank pain for almost 2 months. A CT on 03/06/14 showed a 4 mm stone within the left extra renal pelvis as well as several other left non obstructing renal stones. Dr. Patsi Sears offered him ureteroscopy and stone extraction but he was asymptomatic at the time and denied treatment.   Past Medical History Problems  1. History of Arthritis (V13.4) 2. History of Asthma (493.90) 3. History of cardiac disorder (V12.50) 4. History of esophageal reflux (V12.79) 5. History of hypercholesterolemia (V12.29) 6. History of hypertension (V12.59) 7. History of kidney stones (V13.01) 8. History of Murmur (785.2)  Surgical History Problems  1. History of Heart Surgery 2. History of Lithotripsy 3. History of Neck  Surgery  Current Meds 1. Alfuzosin HCl ER 10 MG Oral Tablet Extended Release 24 Hour; TAKE 1 TABLET  Bedtime;  Therapy: 05Oct2011 to (Evaluate:13Jun2015)  Requested for: 23Jun2014; Last  Rx:18Jun2014 Ordered 2. Finasteride 5 MG Oral Tablet; TAKE ONE TABLET BY MOUTH DAILY;  Therapy: 30Dec2014 to (Evaluate:25Dec2015)  Requested for: 30Dec2014; Last  Rx:30Dec2014 Ordered 3. Fish Oil CAPS;  Therapy: (Recorded:21Sep2010) to Recorded 4. Hydrocodone-Acetaminophen 5-325 MG Oral Tablet; TAKE 1 TO 2 TABLETS EVERY 4 TO  6 HOURS AS NEEDED;  Therapy: 30Dec2014 to (Evaluate:06Apr2015); Last Rx:02Apr2015 Ordered 5. Lisinopril 20 MG Oral Tablet;  Therapy: 21Dec2010 to Recorded 6. Promethazine HCl - 25 MG Oral Tablet; TAKE 1 TABLET EVERY 6 HOURS AS NEEDED  FOR NAUSEA;  Therapy: 05Feb2015 to (Evaluate:04Apr2015)  Requested for: 30Mar2015; Last  Rx:30Mar2015 Ordered 7. Simvastatin 20 MG Oral Tablet;  Therapy: 21Dec2010 to Recorded 8. Staxyn 10 MG Oral Tablet Dispersible; TAKE 10 MG As Directed;  Therapy: 13Sep2011 to (Last Rx:13Sep2011) Ordered 9. Urocit-K 15 15 MEQ (1620 MG) Oral Tablet Extended Release; TAKE 2 TABLET Twice  daily;  Therapy: 13Oct2010 to (Evaluate:04Apr2013)  Requested for: 06Sep2012; Last  Rx:06Sep2012 Ordered 10. Vitamin D (Ergocalciferol) 50000 UNIT Oral Capsule;   Therapy: (Recorded:30Dec2014) to Recorded  Allergies Medication  1. Iodine SOLN  Family History Problems  1. Family history of Family Health Status  Number Of Children   1 daughter 2. No pertinent family history : Mother  Social History Problems  1. Alcohol Use   socially 2. Caffeine Use   2-4 cola 3. Marital History - Currently Married 4. Never A Smoker 5. Occupation:   VP 6. 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  Genitourinary: urinary frequency, feelings of urinary urgency, nocturia, weak urinary stream, urinary stream starts and stops, incomplete emptying of bladder, hematuria and initiating urination requires straining, but no dysuria, no incontinence, no difficulty starting the urinary stream and no post-void dribbling.  Gastrointestinal: flank pain, abdominal pain and heartburn, but no nausea, no vomiting and no constipation.  Constitutional: no fever, no night sweats, not feeling tired (fatigue) and no recent weight loss.  Integumentary: no new skin rashes or lesions and no pruritus.  Eyes: no blurred vision and no diplopia.  ENT: no sore throat and no sinus problems.  Hematologic/Lymphatic: no tendency to easily bruise and no swollen glands.  Cardiovascular: no chest pain and no leg swelling.  Respiratory: no shortness of breath and no cough.  Endocrine: no polydipsia.  Musculoskeletal: joint pain, but no back pain.  Neurological: headache, but no dizziness.  Psychiatric: no anxiety and no depression.    Vitals Vital Signs [Data Includes: Last 1 Day]  Recorded: 20Apr2015 03:32PM  Blood Pressure: 150 / 113 Heart Rate: 71  Physical Exam Constitutional: Well nourished and well developed . No acute distress.  ENT:. The ears and nose are normal in appearance.  Neck: The appearance of the neck is normal and no neck mass is present.  Pulmonary: No respiratory distress and normal respiratory rhythm and effort.  Cardiovascular: Heart rate and rhythm are normal . No peripheral edema.  Abdomen: severe left CVA tenderness.  Genitourinary: Examination of the penis demonstrates no discharge, no masses, no lesions and a normal meatus. The scrotum is without lesions. The right epididymis is palpably normal and non-tender. The left epididymis is palpably normal and non-tender. The right testis is non-tender and without masses.  The left testis is non-tender and without masses.  Skin: Normal skin turgor, no visible rash and no visible skin lesions.  Neuro/Psych:. Mood and affect are appropriate.    Results/Data Selected Results  [facility] 17Apr2015 12:00AM Jethro Bolusannenbaum, [place]s [place]   Test Name Result Flag Reference  [facility] Surgical Images     Diagnostic Images Are Available In PACS For This Exam.   AU CT-STONE PROTOCOL 17Mar2015 12:00AM Jethro Bolusannenbaum, Brynnley Dayrit   Test Name Result Flag Reference  CT-STONE PROTOCOL (Report)    ** RADIOLOGY REPORT BY [facility] **   CLINICAL DATA: Evaluate for renal calculus disease  EXAM: CT ABDOMEN AND PELVIS WITHOUT CONTRAST (URINARY CALCULUS PROTOCOL)  TECHNIQUE: Multidetector CT imaging was performed through the abdomen and pelvis without intravenous contrast to include the urinary tract.  COMPARISON: US RENAL*L* dated 01/25/2014; CT UROGRAM dated 09/10/2009  FINDINGS: The lung bases are unremarkable.  Noncontrast evaluation visualized portions of the liver is unremarkable. The spleen, adrenals, pancreas, right kidney are unremarkable.  Evaluation of the left kidney demonstrates a 4 mm calculus within the dependent portion of a left extrarenal pelvis. Nonobstructing medullary calculi identified within the lower pole ranging in size from 4-3 mm. There is otherwise no evidence there is no evidence of bowel obstruction, enteritis, colitis, diverticulitis within the limitations  of a noncontrast CT. There is no evidence of abdominal aortic aneurysm. There is no evidence of abdominal or pelvic masses, free fluid, loculated fluid collections. There is no evidence of appendicitis. The appendix is identified.  The osseous structures demonstrate no evidence of aggressive appearing osseous lesions.  There is no evidence of abdominal wall nor inguinal hernia.  IMPRESSION: 1. 4 mm calculus within the dependent portion of a small left extrarenal pelvis.  Nonobstructing medullary calculi identified on the left. There is no evidence of obstructive uropathy. 2. Within the limitations of a noncontrast CT is otherwise no evidence of obstructive or inflammatory abnormalities.   Electronically Signed  By: Salome HolmesHector Cooper M.D.  On: 03/06/2014 16:38   Assessment Assessed  1. Flank pain (789.09) 2. Nephrolithiasis (592.0) 3. Renal colic on left side (788.0)  Severe L flank pain, 1 day post JJ removal. I have reviewed the intra-operative photos and x-rays with Dr. Berneice HeinrichManny, who concurs that he will probably need UPJ excision and repair via laparoscopic approach. We will avoid percutaneous nephrostomy, and therefore, he will be admitted for IV pain med control, and have left JJ stent placement in AM.   Plan Flank pain  1. Administered: Ketorolac Tromethamine 60 MG/2ML Injection Solution 2. Administered: Promethazine HCl 25 MG/ML Injection Solution  Admit for IV pain control. (dilauded); followed by cysto and JJ stent in AM.   Signatures Electronically signed by : Jethro BolusSigmund Kyleah Pensabene, M.D.; Apr 09 2014  4:42PM EST

## 2014-04-10 NOTE — Op Note (Signed)
Pre-operative diagnosis :  Embedded L UPJ stone with recurrent renal colic   Postoperative diagnosis:  Same  Operation:  Cystourethroscopy left retrograde PolyGram interpretation, left double-J stent (Polaris 5 French 24 cm).  Surgeon:  Kathie RhodesS. Patsi Searsannenbaum, MD  First assistant:  None  Anesthesia: General LMA  Preparation:  After appropriate preanesthesia, the patient was brought to the operating room, placed on the operating table in the dorsal supine position where general LMA anesthesia was introduced. He remained in this position until he was changed to the dorsal lithotomy position where the pubis was prepped with non-iodine soap, and draped in usual fashion. The history was reviewed. The arm band was double checked.  Review history:  yo married male presents today having severe Lt flank pain. He has had a Percocet at 10am & 12 noon today, which is not touching his pain. His wife removed his stent yesterday. He is s/p cysto/Lt RPG/laser fragmentation of stone on 04/06/14. He also has an imbedded stone in the Lt UPJ area.  [name] has a history of BPH, ED and kidney stones. He remains on alfuzosin and finasteride for his LUTS. His PCP follows his PSA.  He has been having intermittent left flank pain for almost 2 months. A CT on 03/06/14 showed a 4 mm stone within the left extra renal pelvis as well as several other left non obstructing renal stones. Dr. Patsi Searsannenbaum offered him ureteroscopy and stone extraction but he was asymptomatic at the time and denied treatment.      Statement of  Likelihood of Success: Excellent. TIME-OUT observed.:  Procedure:  Cystourethroscopy was accomplished. Serosanguineous fluid was seen to emit from a dilated left ureteral orifice. Left retrograde Cyd Silenceribram was performed which showed normal appearing left ureter. Abnormal left UPJ was felt to be appreciated, contrast easily flowed into the left intrarenal pelvis, without calyceal dilation. A 0.038 floppy tip guidewire  was then passed and coiled in the renal pelvis, and over this, a 5 [place]French by 24 cm Polaris stent was placed, with coiled in the kidney. Retrograde PolyGram was performed again to ensure that the coil was in the proper position, in the intrarenal pelvis, above the UPJ. Following this, the bladder is drained of fluid. The patient was awakened, and taken to recovery room in good condition.

## 2014-04-10 NOTE — Transfer of Care (Signed)
Immediate Anesthesia Transfer of Care Note  Patient: Scott Burch  Procedure(s) Performed: Procedure(s) with comments: CYSTOSCOPY WITH RETROGRADE PYELOGRAM, URETEROSCOPY AND DOUBLE J STENT PLACEMENT (Left) - REQUEST 30 MINS PER PAM.  Patient Location: PACU  Anesthesia Type:General  Level of Consciousness: awake, alert , oriented and patient cooperative  Airway & Oxygen Therapy: Patient Spontanous Breathing and Patient connected to face mask oxygen  Post-op Assessment: Report given to PACU RN and Post -op Vital signs reviewed and stable  Post vital signs: stable  Complications: No apparent anesthesia complications

## 2014-04-10 NOTE — Care Management Note (Signed)
    Page 1 of 1   04/10/2014     1:02:48 PM CARE MANAGEMENT NOTE 04/10/2014  Patient:  Scott Burch,Scott Burch   Account Number:  [id number]  Date Initiated:  04/10/2014  Documentation initiated by:  Lanier ClamMAHABIR,Deontay Ladnier  Subjective/Objective Assessment:   58 Y/O M ADMITTED W/Burch RENAL COLIC.     Action/Plan:   FROM HOME.   Anticipated DC Date:  04/11/2014   Anticipated DC Plan:  HOME/SELF CARE      DC Planning Services  CM consult      Choice offered to / List presented to:             Status of service:  Completed, signed off Medicare Important Message given?   (If response is "NO", the following Medicare IM given date fields will be blank) Date Medicare IM given:   Date Additional Medicare IM given:    Discharge Disposition:  HOME/SELF CARE  Per UR Regulation:  Reviewed for med. necessity/level of care/duration of stay  If discussed at Long Length of Stay Meetings, dates discussed:    Comments:  04/10/14 Gregory Barrick RN,BSN NCM [phone] S/P CYSTO/STENT.D/C HOME NO NEEDS.

## 2014-04-11 ENCOUNTER — Encounter (HOSPITAL_COMMUNITY): Payer: Self-pay | Admitting: Urology

## 2014-04-17 ENCOUNTER — Other Ambulatory Visit: Payer: Self-pay | Admitting: Urology

## 2014-04-19 NOTE — Discharge Summary (Signed)
Physician Discharge Summary  Patient ID: Scott PanderRicky L Burch MRN: [id number] DOB/AGE: 56/04/1956 56 y.o.  Admit date: 04/09/2014 Discharge date: 4/21  Admission Diagnoses: Left ureteral stone  Discharge Diagnoses:  Active Problems:   Renal colic on left side   Discharged Condition: improved  Hospital Course:   Cystoscopy, Left retrograde pyelogram, Left JJ stent  Consults: none  Significant Diagnostic Studies: none  Treatments:  Discharge Exam: Blood pressure 134/76, pulse 56, temperature 98.1 F (36.7 C), temperature source Oral, resp. rate 16, height 5\' 9"  (1.753 m), weight 90.719 kg (200 lb), SpO2 98.00%.  .  Disposition: 01-Home or Self Care  Discharge Orders   Future Orders Complete By Expires   Discharge patient  As directed    Discontinue IV  As directed        Medication List         alfuzosin 10 MG 24 hr tablet  Commonly known as:  UROXATRAL  Take 10 mg by mouth daily with breakfast.     citalopram 40 MG tablet  Commonly known as:  CELEXA  Take 40 mg by mouth daily.     COCONUT OIL PO  Take 1 capsule by mouth daily.     finasteride 5 MG tablet  Commonly known as:  PROSCAR  Take 5 mg by mouth daily.     Fish Oil 1000 MG Caps  Take 2 capsules by mouth daily.     lisinopril 20 MG tablet  Commonly known as:  PRINIVIL,ZESTRIL  Take 20 mg by mouth daily.     ondansetron 8 MG tablet  Commonly known as:  ZOFRAN  Take 1 tablet (8 mg total) by mouth every 8 (eight) hours as needed for nausea or vomiting (1/2 tablet).     oxyCODONE-acetaminophen 5-325 MG per tablet  Commonly known as:  ROXICET  Take 1 tablet by mouth every 4 (four) hours as needed for severe pain.     oxyCODONE-acetaminophen 5-325 MG per tablet  Commonly known as:  ROXICET  Take 1 tablet by mouth every 6 (six) hours as needed for severe pain.     phenazopyridine 200 MG tablet  Commonly known as:  PYRIDIUM  Take 1 tablet (200 mg total) by mouth 3 (three) times daily as needed for  pain.     SPRIX 15.75 MG/SPRAY Soln  Generic drug:  Ketorolac Tromethamine  Place into the nose as needed (kidney stone pain.).     tamsulosin 0.4 MG Caps capsule  Commonly known as:  FLOMAX  Take 1 capsule (0.4 mg total) by mouth daily.     trimethoprim 100 MG tablet  Commonly known as:  TRIMPEX  Take 100 mg by mouth 1 day or 1 dose.     trimethoprim 100 MG tablet  Commonly known as:  TRIMPEX  Take 1 tablet (100 mg total) by mouth 1 day or 1 dose.     URIBEL 118 MG Caps  Take 1 capsule (118 mg total) by mouth 3 times/day as needed-between meals & bedtime.     vitamin B-12 1000 MCG tablet  Commonly known as:  CYANOCOBALAMIN  Take 1,000 mcg by mouth daily.     Vitamin D3 50000 UNITS Caps  Take 1 capsule by mouth every 7 (seven) days. Wednesday.           Follow-up Information   Follow up with Sebastian AcheMANNY, THEODORE, MD. Schedule an appointment as soon as possible for a visit in 1 week. Selena Batten(Kim is making appointment with Dr. Berneice HeinrichManny for [name]. )    Specialty:  Urology   Contact information:   [address] [facility]  [place] [id number] [phone]     RTC for evaluation with Dr. Berneice HeinrichManny.   Signed: Kathi LudwigSigmund I Mal Asher 04/19/2014, 8:38 AM

## 2014-04-24 ENCOUNTER — Encounter (HOSPITAL_BASED_OUTPATIENT_CLINIC_OR_DEPARTMENT_OTHER): Payer: Self-pay | Admitting: *Deleted

## 2014-04-24 NOTE — Progress Notes (Signed)
NPO AFTER MN. ARRIVE AT 0745.  NEEDS ISTAT 8 AND EKG.

## 2014-04-25 ENCOUNTER — Encounter (HOSPITAL_BASED_OUTPATIENT_CLINIC_OR_DEPARTMENT_OTHER)
Admission: RE | Disposition: A | Discharge status: Home / Self Care | Payer: Self-pay | Source: Ambulatory Visit | Attending: Urology

## 2014-04-25 ENCOUNTER — Ambulatory Visit (HOSPITAL_BASED_OUTPATIENT_CLINIC_OR_DEPARTMENT_OTHER): Payer: 59 | Admitting: Anesthesiology

## 2014-04-25 ENCOUNTER — Ambulatory Visit (HOSPITAL_BASED_OUTPATIENT_CLINIC_OR_DEPARTMENT_OTHER)
Admission: RE | Admit: 2014-04-25 | Discharge: 2014-04-25 | Disposition: A | Discharge status: Home / Self Care | Payer: 59 | Source: Ambulatory Visit | Attending: Urology | Admitting: Urology

## 2014-04-25 ENCOUNTER — Encounter (HOSPITAL_BASED_OUTPATIENT_CLINIC_OR_DEPARTMENT_OTHER): Payer: Self-pay | Admitting: Anesthesiology

## 2014-04-25 ENCOUNTER — Other Ambulatory Visit: Payer: Self-pay

## 2014-04-25 ENCOUNTER — Encounter (HOSPITAL_BASED_OUTPATIENT_CLINIC_OR_DEPARTMENT_OTHER): Payer: 59 | Admitting: Anesthesiology

## 2014-04-25 DIAGNOSIS — Z951 Presence of aortocoronary bypass graft: Secondary | ICD-10-CM | POA: Insufficient documentation

## 2014-04-25 DIAGNOSIS — Z91040 Latex allergy status: Secondary | ICD-10-CM | POA: Insufficient documentation

## 2014-04-25 DIAGNOSIS — N400 Enlarged prostate without lower urinary tract symptoms: Secondary | ICD-10-CM | POA: Insufficient documentation

## 2014-04-25 DIAGNOSIS — Z91041 Radiographic dye allergy status: Secondary | ICD-10-CM | POA: Insufficient documentation

## 2014-04-25 DIAGNOSIS — I10 Essential (primary) hypertension: Secondary | ICD-10-CM | POA: Insufficient documentation

## 2014-04-25 DIAGNOSIS — I2510 Atherosclerotic heart disease of native coronary artery without angina pectoris: Secondary | ICD-10-CM | POA: Insufficient documentation

## 2014-04-25 DIAGNOSIS — F411 Generalized anxiety disorder: Secondary | ICD-10-CM | POA: Insufficient documentation

## 2014-04-25 DIAGNOSIS — E785 Hyperlipidemia, unspecified: Secondary | ICD-10-CM | POA: Insufficient documentation

## 2014-04-25 DIAGNOSIS — I447 Left bundle-branch block, unspecified: Secondary | ICD-10-CM | POA: Insufficient documentation

## 2014-04-25 DIAGNOSIS — N200 Calculus of kidney: Secondary | ICD-10-CM | POA: Insufficient documentation

## 2014-04-25 DIAGNOSIS — M5030 Other cervical disc degeneration, unspecified cervical region: Secondary | ICD-10-CM | POA: Insufficient documentation

## 2014-04-25 DIAGNOSIS — N135 Crossing vessel and stricture of ureter without hydronephrosis: Secondary | ICD-10-CM | POA: Insufficient documentation

## 2014-04-25 DIAGNOSIS — Z888 Allergy status to other drugs, medicaments and biological substances status: Secondary | ICD-10-CM | POA: Insufficient documentation

## 2014-04-25 HISTORY — PX: CYSTOSCOPY WITH RETROGRADE PYELOGRAM, URETEROSCOPY AND STENT PLACEMENT: SHX5789

## 2014-04-25 HISTORY — DX: Calculus of ureter: N20.1

## 2014-04-25 HISTORY — PX: HOLMIUM LASER APPLICATION: SHX5852

## 2014-04-25 LAB — POCT I-STAT, CHEM 8
BUN: 19 mg/dL (ref 6–23)
CREATININE: 1.1 mg/dL (ref 0.50–1.35)
Calcium, Ion: 1.22 mmol/L (ref 1.12–1.23)
Chloride: 106 mEq/L (ref 96–112)
GLUCOSE: 102 mg/dL — AB (ref 70–99)
HCT: 39 % (ref 39.0–52.0)
Hemoglobin: 13.3 g/dL (ref 13.0–17.0)
POTASSIUM: 4.2 meq/L (ref 3.7–5.3)
SODIUM: 142 meq/L (ref 137–147)
TCO2: 26 mmol/L (ref 0–100)

## 2014-04-25 SURGERY — CYSTOURETEROSCOPY, WITH RETROGRADE PYELOGRAM AND STENT INSERTION
Anesthesia: General | Site: Ureter | Laterality: Left

## 2014-04-25 MED ORDER — URELLE 81 MG PO TABS
1.0000 | ORAL_TABLET | Freq: Four times a day (QID) | ORAL | Status: DC
  Administered 2014-04-25: 81 mg via ORAL
  Filled 2014-04-25: qty 1

## 2014-04-25 MED ORDER — PROMETHAZINE HCL 25 MG/ML IJ SOLN
6.2500 mg | INTRAMUSCULAR | Status: DC | PRN
  Filled 2014-04-25: qty 1

## 2014-04-25 MED ORDER — FENTANYL CITRATE 0.05 MG/ML IJ SOLN
INTRAMUSCULAR | Status: AC
  Filled 2014-04-25: qty 2

## 2014-04-25 MED ORDER — OXYCODONE HCL 5 MG PO TABS
ORAL_TABLET | ORAL | Status: AC
  Filled 2014-04-25: qty 1

## 2014-04-25 MED ORDER — LACTATED RINGERS IV SOLN
INTRAVENOUS | Status: DC
  Filled 2014-04-25: qty 1000

## 2014-04-25 MED ORDER — URELLE 81 MG PO TABS
ORAL_TABLET | ORAL | Status: AC
  Filled 2014-04-25: qty 1

## 2014-04-25 MED ORDER — HYDROMORPHONE HCL PF 1 MG/ML IJ SOLN
0.2500 mg | INTRAMUSCULAR | Status: DC | PRN
  Filled 2014-04-25: qty 1

## 2014-04-25 MED ORDER — PROPOFOL INFUSION 10 MG/ML OPTIME
INTRAVENOUS | Status: DC | PRN
  Administered 2014-04-25: 200 mL via INTRAVENOUS
  Administered 2014-04-25: 50 mL via INTRAVENOUS

## 2014-04-25 MED ORDER — IOHEXOL 350 MG/ML SOLN
INTRAVENOUS | Status: DC | PRN
  Administered 2014-04-25: 14 mL

## 2014-04-25 MED ORDER — LACTATED RINGERS IV SOLN
INTRAVENOUS | Status: DC | PRN
  Administered 2014-04-25: 09:00:00 via INTRAVENOUS

## 2014-04-25 MED ORDER — SENNOSIDES-DOCUSATE SODIUM 8.6-50 MG PO TABS: 1.0000 | ORAL_TABLET | Freq: Two times a day (BID) | ORAL | Status: DC

## 2014-04-25 MED ORDER — MIDAZOLAM HCL 2 MG/2ML IJ SOLN
INTRAMUSCULAR | Status: AC
  Filled 2014-04-25: qty 2

## 2014-04-25 MED ORDER — ONDANSETRON HCL 4 MG/2ML IJ SOLN
INTRAMUSCULAR | Status: DC | PRN
  Administered 2014-04-25: 4 mg via INTRAVENOUS

## 2014-04-25 MED ORDER — LIDOCAINE HCL 2 % EX GEL
CUTANEOUS | Status: DC | PRN
  Administered 2014-04-25: 1 via URETHRAL

## 2014-04-25 MED ORDER — KETOROLAC TROMETHAMINE 30 MG/ML IJ SOLN
INTRAMUSCULAR | Status: DC | PRN
  Administered 2014-04-25: 30 mg via INTRAVENOUS

## 2014-04-25 MED ORDER — OXYCODONE HCL 5 MG/5ML PO SOLN
5.0000 mg | Freq: Once | ORAL | Status: AC | PRN
  Filled 2014-04-25: qty 5

## 2014-04-25 MED ORDER — OXYCODONE-ACETAMINOPHEN 5-325 MG PO TABS: 1.0000 | ORAL_TABLET | Freq: Four times a day (QID) | ORAL | Status: DC | PRN

## 2014-04-25 MED ORDER — LIDOCAINE HCL (CARDIAC) 20 MG/ML IV SOLN
INTRAVENOUS | Status: DC | PRN
  Administered 2014-04-25: 100 mg via INTRAVENOUS

## 2014-04-25 MED ORDER — SODIUM CHLORIDE 0.9 % IR SOLN
Status: DC | PRN
  Administered 2014-04-25: 4000 mL

## 2014-04-25 MED ORDER — MIDAZOLAM HCL 5 MG/5ML IJ SOLN
INTRAMUSCULAR | Status: DC | PRN
  Administered 2014-04-25: 2 mg via INTRAVENOUS

## 2014-04-25 MED ORDER — FENTANYL CITRATE 0.05 MG/ML IJ SOLN
INTRAMUSCULAR | Status: AC
  Filled 2014-04-25: qty 4

## 2014-04-25 MED ORDER — DIPHENHYDRAMINE HCL 50 MG/ML IJ SOLN
INTRAMUSCULAR | Status: DC | PRN
  Administered 2014-04-25: 50 mg via INTRAVENOUS

## 2014-04-25 MED ORDER — ACETAMINOPHEN 10 MG/ML IV SOLN
INTRAVENOUS | Status: DC | PRN
  Administered 2014-04-25: 1000 mg via INTRAVENOUS

## 2014-04-25 MED ORDER — MEPERIDINE HCL 25 MG/ML IJ SOLN
6.2500 mg | INTRAMUSCULAR | Status: DC | PRN
  Filled 2014-04-25: qty 1

## 2014-04-25 MED ORDER — FENTANYL CITRATE 0.05 MG/ML IJ SOLN
INTRAMUSCULAR | Status: DC | PRN
  Administered 2014-04-25 (×2): 25 ug via INTRAVENOUS
  Administered 2014-04-25: 50 ug via INTRAVENOUS

## 2014-04-25 MED ORDER — OXYCODONE HCL 5 MG PO TABS
5.0000 mg | ORAL_TABLET | Freq: Once | ORAL | Status: AC | PRN
  Administered 2014-04-25: 5 mg via ORAL
  Filled 2014-04-25: qty 1

## 2014-04-25 MED ORDER — BELLADONNA ALKALOIDS-OPIUM 16.2-60 MG RE SUPP
RECTAL | Status: AC
  Filled 2014-04-25: qty 1

## 2014-04-25 MED ORDER — URIBEL 118 MG PO CAPS: 1.0000 | ORAL_CAPSULE | Freq: Two times a day (BID) | ORAL | Status: DC | PRN

## 2014-04-25 MED ORDER — GENTAMICIN IN SALINE 1.6-0.9 MG/ML-% IV SOLN
80.0000 mg | INTRAVENOUS | Status: DC
  Filled 2014-04-25: qty 50

## 2014-04-25 MED ORDER — GENTAMICIN SULFATE 40 MG/ML IJ SOLN
400.0000 mg | INTRAVENOUS | Status: AC
  Administered 2014-04-25: 400 mg via INTRAVENOUS
  Filled 2014-04-25: qty 10

## 2014-04-25 MED ORDER — KETOROLAC TROMETHAMINE 10 MG PO TABS: 10.0000 mg | ORAL_TABLET | Freq: Four times a day (QID) | ORAL | Status: DC | PRN

## 2014-04-25 MED ORDER — BELLADONNA ALKALOIDS-OPIUM 16.2-60 MG RE SUPP
RECTAL | Status: DC | PRN
  Administered 2014-04-25: 1 via RECTAL

## 2014-04-25 MED ORDER — MIDAZOLAM HCL 2 MG/2ML IJ SOLN
INTRAMUSCULAR | Status: AC
  Filled 2014-04-25: qty 4

## 2014-04-25 MED ORDER — DEXAMETHASONE SODIUM PHOSPHATE 10 MG/ML IJ SOLN
INTRAMUSCULAR | Status: DC | PRN
  Administered 2014-04-25: 10 mg via INTRAVENOUS

## 2014-04-25 SURGICAL SUPPLY — 47 items
BAG DRAIN URO-CYSTO SKYTR STRL (DRAIN) ×2 IMPLANT
BAG DRN UROCATH (DRAIN) ×1
BASKET LASER NITINOL 1.9FR (BASKET) ×2 IMPLANT
BASKET STNLS GEMINI 4WIRE 3FR (BASKET) IMPLANT
BASKET ZERO TIP NITINOL 2.4FR (BASKET) IMPLANT
BSKT STON RTRVL 120 1.9FR (BASKET) ×1
BSKT STON RTRVL GEM 120X11 3FR (BASKET)
BSKT STON RTRVL ZERO TP 2.4FR (BASKET)
CANISTER SUCT LVC 12 LTR MEDI- (MISCELLANEOUS) ×2 IMPLANT
CATH INTERMIT  6FR 70CM (CATHETERS) ×2 IMPLANT
CATH URET 5FR 28IN CONE TIP (BALLOONS)
CATH URET 5FR 28IN OPEN ENDED (CATHETERS) IMPLANT
CATH URET 5FR 70CM CONE TIP (BALLOONS) IMPLANT
CLOTH BEACON ORANGE TIMEOUT ST (SAFETY) ×2 IMPLANT
DRAPE CAMERA CLOSED 9X96 (DRAPES) ×2 IMPLANT
ELECT REM PT RETURN 9FT ADLT (ELECTROSURGICAL)
ELECTRODE REM PT RTRN 9FT ADLT (ELECTROSURGICAL) IMPLANT
FIBER LASER FLEXIVA 200 (UROLOGICAL SUPPLIES) IMPLANT
FIBER LASER FLEXIVA 365 (UROLOGICAL SUPPLIES) IMPLANT
FIBER LASER TRAC TIP (UROLOGICAL SUPPLIES) ×1 IMPLANT
GLOVE BIO SURGEON STRL SZ7.5 (GLOVE) ×2 IMPLANT
GLOVE BIOGEL M STER SZ 6 (GLOVE) ×1 IMPLANT
GLOVE BIOGEL PI IND STRL 6.5 (GLOVE) IMPLANT
GLOVE BIOGEL PI INDICATOR 6.5 (GLOVE) ×2
GOWN PREVENTION PLUS LG XLONG (DISPOSABLE) ×2 IMPLANT
GOWN STRL REIN XL XLG (GOWN DISPOSABLE) ×1 IMPLANT
GOWN STRL REUS W/TWL LRG LVL3 (GOWN DISPOSABLE) ×1 IMPLANT
GOWN STRL REUS W/TWL XL LVL3 (GOWN DISPOSABLE) ×1 IMPLANT
GUIDEWIRE 0.038 PTFE COATED (WIRE) IMPLANT
GUIDEWIRE ANG ZIPWIRE 038X150 (WIRE) ×2 IMPLANT
GUIDEWIRE STR DUAL SENSOR (WIRE) ×2 IMPLANT
IV NS 1000ML (IV SOLUTION) ×2
IV NS 1000ML BAXH (IV SOLUTION) IMPLANT
IV NS IRRIG 3000ML ARTHROMATIC (IV SOLUTION) ×3 IMPLANT
KIT BALLIN UROMAX 15FX10 (LABEL) IMPLANT
KIT BALLN UROMAX 15FX4 (MISCELLANEOUS) IMPLANT
KIT BALLN UROMAX 26 75X4 (MISCELLANEOUS)
PACK CYSTOSCOPY (CUSTOM PROCEDURE TRAY) ×2 IMPLANT
SET HIGH PRES BAL DIL (LABEL)
SHEATH ACCESS URETERAL 38CM (SHEATH) ×1 IMPLANT
SHEATH URET ACCESS 12FR/35CM (UROLOGICAL SUPPLIES) IMPLANT
SHEATH URET ACCESS 12FR/55CM (UROLOGICAL SUPPLIES) IMPLANT
STENT POLARIS LOOP 8FR X 26 CM (STENTS) ×1 IMPLANT
SYR 20CC LL (SYRINGE) ×1 IMPLANT
SYRINGE 10CC LL (SYRINGE) ×2 IMPLANT
SYRINGE IRR TOOMEY STRL 70CC (SYRINGE) IMPLANT
TUBE FEEDING 8FR 16IN STR KANG (MISCELLANEOUS) IMPLANT

## 2014-04-25 NOTE — Anesthesia Procedure Notes (Signed)
Procedure Name: LMA Insertion Date/Time: 04/25/2014 8:56 AM Performed by: Tyrone NineSAUVE, Gracie Gupta F Pre-anesthesia Checklist: Patient identified, Timeout performed, Emergency Drugs available, Suction available and Patient being monitored Patient Re-evaluated:Patient Re-evaluated prior to inductionOxygen Delivery Method: Circle system utilized Preoxygenation: Pre-oxygenation with 100% oxygen Intubation Type: IV induction Ventilation: Mask ventilation without difficulty LMA: LMA with gastric port inserted LMA Size: 5.0 Number of attempts: 1 Tube secured with: Tape Dental Injury: Teeth and Oropharynx as per pre-operative assessment

## 2014-04-25 NOTE — Brief Op Note (Signed)
04/25/2014  10:04 AM  PATIENT:  Scott Burch  58 y.o. male  PRE-OPERATIVE DIAGNOSIS:  Impacted Left Ureteral Stone  POST-OPERATIVE DIAGNOSIS:  Impacted Left Ureteral Stone  PROCEDURE:  Procedure(s): CYSTOSCOPY WITH RETROGRADE PYELOGRAM, URETEROSCOPY AND STENT REPLACEMENT. ENDOPYELOTOMY (Left) HOLMIUM LASER APPLICATION (Left)  SURGEON:  Surgeon(s) and Role:    * Sebastian Acheheodore Shannel Zahm, MD - Primary  PHYSICIAN ASSISTANT:   ASSISTANTS: none   ANESTHESIA:   general  EBL:     BLOOD ADMINISTERED:none  DRAINS: none   LOCAL MEDICATIONS USED:  LIDOCAINE   SPECIMEN:  Source of Specimen:  Left Renal Stone Fragments  DISPOSITION OF SPECIMEN:  [facility] for Compositional Analysis  COUNTS:  YES  TOURNIQUET:  * No tourniquets in log *  DICTATION: .Other Dictation: Dictation Number [id number]  PLAN OF CARE: Discharge to home after PACU  PATIENT DISPOSITION:  PACU - hemodynamically stable.   Delay start of Pharmacological VTE agent (>24hrs) due to surgical blood loss or risk of bleeding: not applicable

## 2014-04-25 NOTE — Transfer of Care (Signed)
Immediate Anesthesia Transfer of Care Note  Patient: Charlynne PanderRicky L Oleson  Procedure(s) Performed: Procedure(s): CYSTOSCOPY WITH RETROGRADE PYELOGRAM, URETEROSCOPY AND STENT REPLACEMENT. ENDOPYELOTOMY (Left) HOLMIUM LASER APPLICATION (Left)  Patient Location: PACU  Anesthesia Type:General  Level of Consciousness: awake, alert , oriented and patient cooperative  Airway & Oxygen Therapy: Patient Spontanous Breathing and Patient connected to nasal cannula oxygen  Post-op Assessment: Report given to PACU RN and Post -op Vital signs reviewed and stable  Post vital signs: Reviewed and stable  Complications: No apparent anesthesia complications

## 2014-04-25 NOTE — Anesthesia Postprocedure Evaluation (Signed)
Anesthesia Post Note  Patient: Charlynne PanderRicky L Bodnar  Procedure(s) Performed: Procedure(s) (LRB): CYSTOSCOPY WITH RETROGRADE PYELOGRAM, URETEROSCOPY AND STENT REPLACEMENT. ENDOPYELOTOMY (Left) HOLMIUM LASER APPLICATION (Left)  Anesthesia type: General  Patient location: PACU  Post pain: Pain level controlled  Post assessment: Post-op Vital signs reviewed  Last Vitals: BP 120/69  Pulse 54  Temp(Src) 36.8 C (Oral)  Resp 12  Ht 5\' 9"  (1.753 m)  Wt 200 lb (90.719 kg)  BMI 29.52 kg/m2  SpO2 97%  Post vital signs: Reviewed  Level of consciousness: sedated  Complications: No apparent anesthesia complications

## 2014-04-25 NOTE — Anesthesia Preprocedure Evaluation (Addendum)
Anesthesia Evaluation  Patient identified by MRN, date of birth, ID band Patient awake    Reviewed: Allergy & Precautions, H&P , NPO status , Patient's Chart, lab work & pertinent test results  Airway Mallampati: III TM Distance: >3 FB Neck ROM: Full    Dental  (+) Teeth Intact, Dental Advisory Given   Pulmonary neg pulmonary ROS,    Pulmonary exam normal       Cardiovascular hypertension, Pt. on medications + dysrhythmias Rhythm:Regular Rate:Normal  25-Apr-2014  Normal sinus rhythm Left bundle branch block   Neuro/Psych PSYCHIATRIC DISORDERS Anxiety negative neurological ROS  negative psych ROS   GI/Hepatic negative GI ROS, Neg liver ROS,   Endo/Other  negative endocrine ROS  Renal/GU negative Renal ROS     Musculoskeletal negative musculoskeletal ROS (+)   Abdominal   Peds  Hematology negative hematology ROS (+)   Anesthesia Other Findings   Reproductive/Obstetrics negative OB ROS                      Anesthesia Physical Anesthesia Plan  ASA: II  Anesthesia Plan: General   Post-op Pain Management:    Induction: Intravenous  Airway Management Planned: LMA  Additional Equipment:   Intra-op Plan:   Post-operative Plan: Extubation in OR  Informed Consent:   Dental advisory given  Plan Discussed with: CRNA and Anesthesiologist  Anesthesia Plan Comments:         Anesthesia Quick Evaluation                                  Anesthesia Evaluation  Patient identified by MRN, date of birth, ID band Patient awake    Reviewed: Allergy & Precautions, H&P , NPO status , Patient's Chart, lab work & pertinent test results  Airway Mallampati: II TM Distance: >3 FB Neck ROM: Full    Dental  (+) Dental Advisory Given   Pulmonary neg pulmonary ROS,  breath sounds clear to auscultation        Cardiovascular hypertension, Pt. on medications + dysrhythmias  Rhythm:Regular Rate:Normal     Neuro/Psych negative neurological ROS  negative psych ROS   GI/Hepatic negative GI ROS, Neg liver ROS,   Endo/Other  negative endocrine ROS  Renal/GU Renal diseasenegative Renal ROS     Musculoskeletal negative musculoskeletal ROS (+)   Abdominal   Peds  Hematology negative hematology ROS (+)   Anesthesia Other Findings   Reproductive/Obstetrics                           Anesthesia Physical  Anesthesia Plan  ASA: II  Anesthesia Plan: General   Post-op Pain Management:    Induction: Intravenous  Airway Management Planned: LMA  Additional Equipment:   Intra-op Plan:   Post-operative Plan: Extubation in OR  Informed Consent: I have reviewed the patients History and Physical, chart, labs and discussed the procedure including the risks, benefits and alternatives for the proposed anesthesia with the patient or authorized representative who has indicated his/her understanding and acceptance.   Dental advisory given  Plan Discussed with: CRNA  Anesthesia Plan Comments:         Anesthesia Quick Evaluation                                   Anesthesia Evaluation  Patient identified  by MRN, date of birth, ID band Patient awake    Reviewed: Allergy & Precautions, H&P , NPO status , Patient's Chart, lab work & pertinent test results  Airway Mallampati: II TM Distance: >3 FB Neck ROM: Full    Dental  (+) Dental Advisory Given   Pulmonary neg pulmonary ROS,  breath sounds clear to auscultation        Cardiovascular hypertension, Pt. on medications + dysrhythmias Rhythm:Regular Rate:Normal     Neuro/Psych negative neurological ROS  negative psych ROS   GI/Hepatic negative GI ROS, Neg liver ROS,   Endo/Other  negative endocrine ROS  Renal/GU Renal diseasenegative Renal ROS     Musculoskeletal negative musculoskeletal ROS (+)   Abdominal   Peds  Hematology negative  hematology ROS (+)   Anesthesia Other Findings   Reproductive/Obstetrics                           Anesthesia Physical Anesthesia Plan  ASA: II  Anesthesia Plan: General   Post-op Pain Management:    Induction: Intravenous  Airway Management Planned: LMA  Additional Equipment:   Intra-op Plan:   Post-operative Plan: Extubation in OR  Informed Consent: I have reviewed the patients History and Physical, chart, labs and discussed the procedure including the risks, benefits and alternatives for the proposed anesthesia with the patient or authorized representative who has indicated his/her understanding and acceptance.   Dental advisory given  Plan Discussed with: CRNA  Anesthesia Plan Comments:         Anesthesia Quick Evaluation                                   Anesthesia Evaluation  Patient identified by MRN, date of birth, ID band Patient awake    Reviewed: Allergy & Precautions, H&P , NPO status , Patient's Chart, lab work & pertinent test results  Airway Mallampati: II TM Distance: >3 FB Neck ROM: Full    Dental  (+) Dental Advisory Given   Pulmonary neg pulmonary ROS,  breath sounds clear to auscultation        Cardiovascular hypertension, Pt. on medications + dysrhythmias Rhythm:Regular Rate:Normal     Neuro/Psych negative neurological ROS  negative psych ROS   GI/Hepatic negative GI ROS, Neg liver ROS,   Endo/Other  negative endocrine ROS  Renal/GU Renal diseasenegative Renal ROS     Musculoskeletal negative musculoskeletal ROS (+)   Abdominal   Peds  Hematology negative hematology ROS (+)   Anesthesia Other Findings   Reproductive/Obstetrics                           Anesthesia Physical Anesthesia Plan  ASA: II  Anesthesia Plan: General   Post-op Pain Management:    Induction: Intravenous  Airway Management Planned: LMA  Additional Equipment:   Intra-op  Plan:   Post-operative Plan: Extubation in OR  Informed Consent: I have reviewed the patients History and Physical, chart, labs and discussed the procedure including the risks, benefits and alternatives for the proposed anesthesia with the patient or authorized representative who has indicated his/her understanding and acceptance.   Dental advisory given  Plan Discussed with: CRNA  Anesthesia Plan Comments:         Anesthesia Quick Evaluation

## 2014-04-25 NOTE — Discharge Instructions (Signed)
1 - You may have urinary urgency (bladder spasms) and bloody urine on / off with stent in place. This is normal. ° °2 - Call MD or go to ER for fever >102, severe pain / nausea / vomiting not relieved by medications, or acute change in medical status °[facility] °[phone] °Post Ureteroscopy With or Without Stent Instructions ° °Definitions: ° °Ureter: The duct that transports urine from the kidney to the bladder. °Stent:   A plastic hollow tube that is placed into the ureter, from the kidney to the                 bladder to prevent the ureter from swelling shut. ° °GENERAL INSTRUCTIONS: ° °Despite the fact that no skin incisions were used, the area around the ureter and bladder is raw and irritated. The stent is a foreign body which will further irritate the bladder wall. This irritation is manifested by increased frequency of urination, both day and night, and by an increase in the urge to urinate. In some, the urge to urinate is present almost always. Sometimes the urge is strong enough that you may not be able to stop yourself from urinating. The only real cure is to remove the stent and then give time for the bladder wall to heal which can't be done until the danger of the ureter swelling shut has passed, which varies. ° °You may see some blood in your urine while the stent is in place and a few days afterwards. Do not be alarmed, even if the urine was clear for a while. Get off your feet and drink lots of fluids until clearing occurs. If you start to pass clots or don't improve, call us. ° °DIET: °You may return to your normal diet immediately. Because of the raw surface of your bladder, alcohol, spicy foods, acid type foods and drinks with caffeine may cause irritation or frequency and should be used in moderation. To keep your urine flowing freely and to avoid constipation, drink plenty of fluids during the day ( 8-10 glasses ). °Tip: Avoid cranberry juice because it is very  acidic. ° °ACTIVITY: °Your physical activity doesn't need to be restricted. However, if you are very active, you may see some blood in your urine. We suggest that you reduce your activity under these circumstances until the bleeding has stopped. ° °BOWELS: °It is important to keep your bowels regular during the postoperative period. Straining with bowel movements can cause bleeding. A bowel movement every other day is reasonable. Use a mild laxative if needed, such as Milk of Magnesia 2-3 tablespoons, or 2 Dulcolax tablets. Call if you continue to have problems. If you have been taking narcotics for pain, before, during or after your surgery, you may be constipated. Take a laxative if necessary. ° ° °MEDICATION: °You should resume your pre-surgery medications unless told not to. In addition you will often be given an antibiotic to prevent infection. These should be taken as prescribed until the bottles are finished unless you are having an unusual reaction to one of the drugs. ° °PROBLEMS YOU SHOULD REPORT TO US: °· Fevers over 100.5 Fahrenheit. °· Heavy bleeding, or clots ( See above notes about blood in urine ). °· Inability to urinate. °· Drug reactions ( hives, rash, nausea, vomiting, diarrhea ). °· Severe burning or pain with urination that is not improving. ° °FOLLOW-UP: °You will need a follow-up appointment to monitor your progress. Call for this appointment at the number listed above.   Usually the first appointment will be about three to fourteen days after your surgery. ° ° ° ° ° °Post Anesthesia Home Care Instructions ° °Activity: °Get plenty of rest for the remainder of the day. A responsible adult should stay with you for 24 hours following the procedure.  °For the next 24 hours, DO NOT: °-Drive a car °-Operate machinery °-Drink alcoholic beverages °-Take any medication unless instructed by your physician °-Make any legal decisions or sign important papers. ° °Meals: °Start with liquid foods such as  gelatin or soup. Progress to regular foods as tolerated. Avoid greasy, spicy, heavy foods. If nausea and/or vomiting occur, drink only clear liquids until the nausea and/or vomiting subsides. Call your physician if vomiting continues. ° °Special Instructions/Symptoms: °Your throat may feel dry or sore from the anesthesia or the breathing tube placed in your throat during surgery. If this causes discomfort, gargle with warm salt water. The discomfort should disappear within 24 hours. ° °

## 2014-04-25 NOTE — H&P (Signed)
Charlynne PanderRicky L Lampley is an 58 y.o. male.    Chief Complaint: Pre-Op Left Ureteroscopic Stone Manipulation  HPI:   1 - Recurrent Nephrolithiasis -  Pre 2015 URS and SWL x many, mostly left sided 03/2014 - Left embedded 4mm UPJ stone + scattered lwer pole stones (total volume 1cm, SSD 10cm, 900HU). Difficult to treat UPJ stone ureteroscopically, felt may need  staged endoscopic vs pyelolithotomy / pyeloplasty. CT 03/2014 without hydro or significant extra-renal pelvis.    PMH sig for CAD/CABG (follows [facility], no limiting symptoms, no strong blood thinners), Nephrolithiasis  Today Clide CliffRicky is seen for re-attempt left ureteroscopic stone manipulation of his impacted left UPJ stone. NO recent fevers. Some stent colic as expected.  Most recent UA without infectious parameters.  Past Medical History  Diagnosis Date  . Hypertension   . Hyperlipidemia   . History of kidney stones   . BPH (benign prostatic hypertrophy)   . LBBB (left bundle branch block)   . Frequency of urination   . Urgency of urination   . DDD (degenerative disc disease), cervical   . History of atrial septal defect repair     CARDIOLOGIST --  DR Earma ReadingBASHORE  AT [facility] (LOV 11/2013)  . Left ureteral calculus   . History of atrial septal defect repair     Past Surgical History  Procedure Laterality Date  . Cervical fusion  X2    C3 -- C4  &  C4 -- C5  . Cysto/  left retrograde pyelogram/  left ureter dilation/  stent placement  06-21-2000  . Cysto/  left ureteroscopy/  stent placement  01-15-2006  . Extracorporeal shock wave lithotripsy  x7  last one 2010  . Asd repair  2000   [facility]  . Cervical laminectomy  07/2013    C6 -- 7  . Sleep study  2005 (APPROX)    NEGATIVE  . Cystoscopy with retrograde pyelogram, ureteroscopy and stent placement Left 04/06/2014    Procedure: CYSTOSCOPY WITH LEFT RETROGRADE PYELOGRAM, LEFT URETEROSCOPY, STONE EXTRACTION , DOUBLE J STENT PLACEMENT;  Surgeon: Kathi LudwigSigmund I Tannenbaum, MD;  [facility]  [place];  Service: Urology;  Laterality: Left;  . Holmium laser application Left 04/06/2014    Procedure: HOLMIUM LASER APPLICATION;  Surgeon: Kathi LudwigSigmund I Tannenbaum, MD;  [facility] [place];  Service: Urology;  Laterality: Left;  . Cystoscopy with retrograde pyelogram, ureteroscopy and stent placement Left 04/10/2014    Procedure: CYSTOSCOPY WITH RETROGRADE PYELOGRAM, URETEROSCOPY AND DOUBLE J STENT PLACEMENT;  Surgeon: Kathi LudwigSigmund I Tannenbaum, MD;  Location: [facility];  Service: Urology;  Laterality: Left;    History reviewed. No pertinent family history. Social History:  reports that he has never smoked. He has never used smokeless tobacco. He reports that he drinks alcohol. He reports that he does not use illicit drugs.  Allergies:  Allergies  Allergen Reactions  . Contrast Media [Iodinated Diagnostic Agents] Anaphylaxis and Hives  . Shellfish Allergy Hives    All types  . Zocor [Simvastatin] Other (See Comments)    "flu symptoms"    No prescriptions prior to admission    No results found for this or any previous visit (from the past 48 hour(s)). No results found.  Review of Systems  Constitutional: Negative.  Negative for fever and chills.  HENT: Negative.   Eyes: Negative.   Respiratory: Negative.   Cardiovascular: Negative.   Gastrointestinal: Negative.  Negative for nausea and vomiting.  Genitourinary: Positive for urgency and frequency. Negative for flank  pain.  Musculoskeletal: Negative.   Skin: Negative.   Neurological: Negative.   Endo/Heme/Allergies: Negative.   Psychiatric/Behavioral: Negative.     Height 5\' 9"  (1.753 m), weight 90.719 kg (200 lb). Physical Exam  Constitutional: He is oriented to person, place, and time. He appears well-developed and well-nourished.  HENT:  Head: Normocephalic and atraumatic.  Eyes: EOM are normal. Pupils are equal, round, and reactive to light.  Neck: Normal range of motion. Neck supple.   Cardiovascular: Normal rate and regular rhythm.   Respiratory: Effort normal.  GI: Soft.  Genitourinary: Penis normal.  Musculoskeletal: Normal range of motion.  Neurological: He is alert and oriented to person, place, and time.  Skin: Skin is warm and dry.  Psychiatric: He has a normal mood and affect. His behavior is normal. Judgment and thought content normal.     Assessment/Plan  1 - Recurrent Nephrolithiasis - Challenging situation with impacted left UPJ stone. Would favor initial attempt of repeat ureteroscopic approach, even if results in some minor luminal disruption as he has minimal hydro and no extra-renal pelvis and multiple ipsilateral shockwave lithotripsies, which would make primary robotic pyelolithotomy very difficult. If repeat endoscopic approach fails, would then reconsider pyelolithotomy. I explained he is at risk of future stricture regardless of approach.   We rediscussed ureteroscopic stone manipulation with basketing and laser-lithotripsy in detail.  We rediscussed risks including bleeding, infection, damage to kidney / ureter  bladder, rarely loss of kidney. We rediscussed anesthetic risks and rare but serious surgical complications including DVT, PE, MI, and mortality. We specifically readdressed that in 5-10% of cases a staged approach is required with stenting followed by re-attempt ureteroscopy if anatomy unfavorable. The patient voiced understanding and wises to proceed today as planned.  Sebastian Acheheodore Dayvion Sans 04/25/2014, 6:32 AM

## 2014-04-26 ENCOUNTER — Encounter (HOSPITAL_BASED_OUTPATIENT_CLINIC_OR_DEPARTMENT_OTHER): Payer: Self-pay | Admitting: Urology

## 2014-04-26 NOTE — Anesthesia Postprocedure Evaluation (Signed)
Anesthesia Post Note  Patient: Charlynne PanderRicky L Pickard  Procedure(s) Performed: Procedure(s) (LRB): CYSTOSCOPY WITH RETROGRADE PYELOGRAM, URETEROSCOPY AND STENT REPLACEMENT. ENDOPYELOTOMY (Left) HOLMIUM LASER APPLICATION (Left)  Anesthesia type: General  Patient location: PACU  Post pain: Pain level controlled  Post assessment: Post-op Vital signs reviewed  Last Vitals: BP 123/70  Pulse 62  Temp(Src) 36.2 C (Oral)  Resp 18  Ht 5\' 9"  (1.753 m)  Wt 200 lb (90.719 kg)  BMI 29.52 kg/m2  SpO2 96%  Post vital signs: Reviewed  Level of consciousness: sedated  Complications: No apparent anesthesia complications

## 2014-04-26 NOTE — Op Note (Signed)
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  PROCEDURES: 1. Cystoscopy with left retrograde pyelogram and interpretation. 2. Left ureteroscopy with laser lithotripsy. 3. Left ureteral stent exchange, 8 x 26 Polaris, no tether. 4. Left endopyelotomy.  ESTIMATED BLOOD LOSS:  Nil.  COMPLICATIONS:  None.  SPECIMEN:  Left renal stone fragments for compositional analysis.  FINDINGS: 1. 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 2. Left lower midpole renal stones.  INDICATION:  Scott Burch is a very pleasant 58 year old gentleman with long history of recurrent nephrolithiasis, most commonly on his left side.  It has been managed previously with multitude techniques including medical therapy, shockwave lithotripsy, and ureteroscopy.  He then unfortunately had a current bout of left-sided nephrolithiasis, it was then multifocal, it was previously managed with left ureteroscopic stone manipulation several weeks ago, and at that time, he was found to have what appeared to be an impacted stone with associated left ureteral stricture at the area of his UPJ.  He underwent a trial of stone free status, however, had severe colic and therefore, stent once again replaced.  He was referred for consideration of possible endopyelotomy versus pyeloplasty with pyelolithotomy.  As discussed that Scott Burch at present cons of attempted repeat endoscopic management with left ureteroscopic stone manipulation and endopyelotomy  versus a robotic pyeloplasty with the former being much less invasive with the potential to reach was in goals and I expressed my concern that pyeloplasty in the situation may be exceptionally difficult given his lack of hydronephrosis and multiple prior ipsilateral stone procedures including multiple shockwave lithotripsies.  As such, he was counseled towards repeat trial of endoscopic management.  Informed consent was obtained and placed in the medical record.  PROCEDURE IN DETAIL:  The patient being Scott Burch was verified. Procedure being left ureteroscopic stone manipulation with endopyelotomy was confirmed.  Procedure was carried out.  Time-out was performed. Intravenous antibiotics were administered.  General LMA anesthesia was introduced.  The patient was also given stress dose steroids at 15 mg of IV Benadryl given his shellfish and questionable contrast allergy. Next, cystoscopy was performed using a 22-French rigid cystoscope with a 12-degree offset lens.  Inspection of the anterior and posterior urethra was unremarkable.  Inspection of the urinary bladder revealed a well- positioned distal end of the left ureteral stent, this was grasped and brought to the level of urethral meatus, to which, a 0.038 Glidewire was advanced at the level of the upper pole, this was exchanged for a 6- French end-hole catheter and left retrograde pyelogram was obtained.  Gentle left retrograde pyelogram demonstrated a single left ureter with single-system left kidney.  There  was questionable narrowing at the area of the UPJ; however, purposely contrast was not pushed under much pressure given his history of contrast allergy.  The Glidewire was once again advanced at the level of the upper pole and set aside as a safety wire.  Next, semi-rigid ureteroscopy was performed at the distal three- fourth of the left ureter alongside a separate Sensor working wire.  No mucosal abnormalities or  calcifications were noted.  Next, the 38-cm 12/14 ureteral access sheath was placed over the Sensor working wire to the level of the proximal ureter and 8-French feeding tube in the urinary bladder for pressure release.  Next, flexible digital ureteroscopy was performed using an 8-French digital ureteroscope.  This allowed inspection of the proximal ureter and panendoscopy of the left kidney.  At the area of the UPJ, there was indeed appeared to be a moderate grade stricture with intraluminal obstruction photographs were taken.  There appeared to be scarified white stricture tissue on the medial aspect of the UPJ and this area was nonpulsatile, again this was most consistent with intraluminal stricture, not with the crossing vessel.  The area was then successfully navigated with the ureteroscope and inspection of each calix x2 revealed a foci of mobile stones in the left lower mid calyx, likely corresponding to prior area of possibly impacted stone.  The stones did appear to be too large for simple basketing.  As such, holmium laser energy was applied to the stone using settings of 0.2 joules and 10 hertz, fragmenting the stones into multiple smaller pieces.  Taking great care to avoid excessive fragmentation given that we are planning on the common endopyelotomy and wanting to avoid the risk of fragment migration outside of the kidney. This was successful and fragments were generated approximately 3 mm or less in diameter.  These were each grasped with an escape-type basket and brought out their entirety and set aside for compositional analysis. Repeat very careful systematic inspection of the kidney x2 revealed complete resolution of all stone fragments larger than 1/3 mm.  There was only some small dust in the upper pole, which was carefully irrigated and it was felt that all significant stone fragments were removed to a very great extent.  Attention was then directed at endopyelotomy.   The area of scarified previously mentioned tissue was incised using laser fiber at settings of 0.5 joules and 5 hertz under very careful direct vision, placing an incision directly through the scarified tissue down to what appeared to be the very beginnings of some extraluminal periureteral fatty tissue.  There were no pulsatile vessels encountered and hemostasis was excellent following these maneuvers.  The UPJ was estimated to be 10-12-French following these maneuvers and photodocumentation was performed.  There was no evidence of obviously imbedded stone underneath this scarified tissue.  As such, the ureteral access sheath was carefully removed under ureteroscopic vision.  No mucosal abnormalities were found.  Finally, a new 8 x 26 Polaris-type stent was carefully placed over the remaining safety wire using cystoscopic and fluoroscopic guidance.  Proximal end was placed in the upper pole.  Distal end with good deployment in the urinary bladder, which did not cross the trigone.  Bladder was emptied per cystoscope. Procedure was then terminated.  The patient tolerated the procedure well.  There were no immediate periprocedural complications.  The patient was taken to the postanesthesia care unit in stable condition.          ______________________________ Scott Ache, MD  TM/MEDQ  D:  04/25/2014  T:  04/26/2014  Job:  [id number]

## 2016-07-14 ENCOUNTER — Ambulatory Visit (INDEPENDENT_AMBULATORY_CARE_PROVIDER_SITE_OTHER): Payer: 59 | Admitting: Neurology

## 2016-07-14 ENCOUNTER — Other Ambulatory Visit: Payer: Self-pay | Admitting: Neurology

## 2016-07-14 ENCOUNTER — Telehealth: Payer: Self-pay | Admitting: Neurology

## 2016-07-14 ENCOUNTER — Encounter: Payer: Self-pay | Admitting: Neurology

## 2016-07-14 VITALS — BP 149/78 | HR 71 | Ht 69.0 in | Wt 214.0 lb

## 2016-07-14 DIAGNOSIS — M5010 Cervical disc disorder with radiculopathy, unspecified cervical region: Secondary | ICD-10-CM

## 2016-07-14 DIAGNOSIS — R202 Paresthesia of skin: Secondary | ICD-10-CM

## 2016-07-14 DIAGNOSIS — R29898 Other symptoms and signs involving the musculoskeletal system: Secondary | ICD-10-CM

## 2016-07-14 DIAGNOSIS — M6289 Other specified disorders of muscle: Secondary | ICD-10-CM | POA: Diagnosis not present

## 2016-07-14 DIAGNOSIS — M542 Cervicalgia: Secondary | ICD-10-CM

## 2016-07-14 DIAGNOSIS — G8929 Other chronic pain: Secondary | ICD-10-CM | POA: Diagnosis not present

## 2016-07-14 MED ORDER — GABAPENTIN 300 MG PO CAPS: 300.0000 mg | ORAL_CAPSULE | Freq: Three times a day (TID) | ORAL | 11 refills | Status: AC

## 2016-07-14 MED ORDER — ALPRAZOLAM 0.5 MG PO TABS: ORAL_TABLET | ORAL | 0 refills | Status: AC

## 2016-07-14 MED ORDER — METHYLPREDNISOLONE 4 MG PO TBPK: ORAL_TABLET | ORAL | 1 refills | Status: AC

## 2016-07-14 NOTE — Progress Notes (Signed)
[facility]    Provider:  Dr Lucia Gaskins Referring Provider: Dr. Otelia Sergeant Primary Care Physician:  Barbette Reichmann, MD  CC:  Chronic neck pain   HPI:  Scott Burch is a 60 y.o. male here as a referral from Dr. Otelia Sergeant for chronic neck pain. No inciting events, no trauma or falls. Last surgery was in 07/2013 C7-T1 cervical laminectomy. He takes vicodin prn for the pain. He has had 3 cervical surgeries. PMHx anxiety, cervical degenerative disease with radiculopathy, chronic headache, depression, elevated LFTs, HTN, fatigue, spasm of muscles, nephrolithiasis. He has had chronic neck pain since the early 2000s s/p 3 surgeries, patient has chronic pain and reduced range of motion in a says his neck sounds like popcorn when he moves it. Has not had an MRI of his neck since 2014. He has shooting pains into his arms and hands especially the right hand, he has weakness in the arms and hands, chronic, no changes in bowel or bladder. His hands ache, burn and cramp up. In the last year the pain is worse. He can't turn his neck, he has to turn his body. He rides motorcycles and this makes it difficult to ride. Today the pain is 8/10. He keeps a 6/10 - 8/10 daily in pain. He take alleve 4x a day, 4 at a time, discussed that this is dangerous and can cause renal damage as well as the possibility of GI bleeding, patient says he has no choice due to the pain. He reports that he has never tried gabapentin, lyrica, cymbalta. He tried amitriptyline in the past before any surgeries and they didn't help and that's why he had surgery.  His muscles are tight too, he has significant cervical muscle tightness and spasms. He says he can't live like this. He was given a refill for Vicodin yesterday from his physician at [facility]. No falls and no difficulty walking. No weakness or paresthesias in the feet or legs or thorax. No other focal neurologic deficits or complaints.  Reviewed notes, labs and imaging from outside  physicians, which showed:  Surgeries include: 07/2013: C7-T1 Posterior cervical hemilaminectomy and foraminotomy   2009: C4-5 anterior cervical diskectomy with structural cage and plating  2012: C7-T1 anterior cervical diskectomy with fusion  He had undergone conservative treatment with over-the-counter medications, physical therapy, chiropractic therapy, heat cold treatment, massage, epidural injections which have mainly been facette blocks 1 of the right C3-4 5 6  level in 1 of the left C3-4 5 6  level. After the injections he did show some improvement for short periods of time. He then underwent surgery in 2014. He last saw Pain management in 2014-2015 that showed he was on Lyrica as well as gabapentin in the past. . There are no records seen for any other pain management visits since 2015. He had left C3-C6 facet blocks and right C3-C6 facet blocks in 2014. No procedures or blocks in 2015 seen.  BUN 19 and creatinine 1.1  MRI of the cervical spine 01/2013(MRi taken previous to last surgery in 07/2013):  FINDINGS: Patient is status post C4-T1 ACDF with pedicle screws and plate noted at W0-J8 and C7-T1. No abnormal signal or enhancement in the spinal cord or vertebral bodies. Spinal cord caliber is normal. Normal craniocervical junction. There is straightening of the normal lordotic curvature of the cervical spine. Visualized paraspinal and neck soft tissues unremarkable. --C1-C2 = Unremarkable. --C2-C3 = There is no evidence of spinal stenosis, disc bulge or neural foraminal narrowing. --C3-C4 = small central disc protrusion  is seen without spinal canal stenosis. Mild right uncovertebral hypertrophy with mild right neuroforaminal narrowing. --C4-C5 = There is no evidence of spinal stenosis, disc bulge or neural foraminal narrowing. --C5-C6 = bony proliferation is seen which indents the ventral thecal sac without spinal canal stenosis or significant neuroforaminal narrowing. --C6-C7 = There is no  evidence of spinal stenosis, disc bulge or neural foraminal narrowing. --C7-T1 =endplate irregularity and signal changes are seen with disc height loss and diffuse disc bulge without spinal canal stenosis. Bilateral uncovertebral hypertrophy results in severe bilateral neuroforaminal narrowing.  IMPRESSION: Status post C4-T1 ACDF with severe bilateral C7-T1 neural foraminal narrowing without spinal canal stenosis.  Review of Systems: Patient complains of symptoms per HPI as well as the following symptoms: insomnia(due to neck pain), headache, no CP, no SOB. Pertinent negatives per HPI. All others negative.   Social History   Social History  . Marital status: Married    Spouse name: Gwen  . Number of children: N/A  . Years of education: N/A   Occupational History  . [employer]     Social History Main Topics  . Smoking status: Never Smoker  . Smokeless tobacco: Never Used  . Alcohol use Yes     Comment: rare  . Drug use: No  . Sexual activity: Not on file   Other Topics Concern  . Not on file   Social History Narrative   Lives with wife      Caffeine use: 1-2 small mountain dew per day    History reviewed. No pertinent family history.  Past Medical History:  Diagnosis Date  . BPH (benign prostatic hypertrophy)   . DDD (degenerative disc disease), cervical   . Frequency of urination   . History of atrial septal defect repair    CARDIOLOGIST --  DR Earma Reading  AT [facility] (LOV 11/2013)  . History of atrial septal defect repair   . History of kidney stones   . Hyperlipidemia   . Hypertension   . LBBB (left bundle branch block)   . Left ureteral calculus   . Urgency of urination     Past Surgical History:  Procedure Laterality Date  . ASD REPAIR  2000   [facility]  . CERVICAL FUSION  X2   C3 -- C4  &  C4 -- C5  . CERVICAL LAMINECTOMY  07/2013   C6 -- 7  . CYSTO/  LEFT RETROGRADE PYELOGRAM/  LEFT URETER DILATION/  STENT PLACEMENT  06-21-2000  . CYSTO/  LEFT  URETEROSCOPY/  STENT PLACEMENT  01-15-2006  . CYSTOSCOPY WITH RETROGRADE PYELOGRAM, URETEROSCOPY AND STENT PLACEMENT Left 04/06/2014   Procedure: CYSTOSCOPY WITH LEFT RETROGRADE PYELOGRAM, LEFT URETEROSCOPY, STONE EXTRACTION , DOUBLE J STENT PLACEMENT;  Surgeon: Kathi Ludwig, MD;  Location: [facility];  Service: Urology;  Laterality: Left;  . CYSTOSCOPY WITH RETROGRADE PYELOGRAM, URETEROSCOPY AND STENT PLACEMENT Left 04/10/2014   Procedure: CYSTOSCOPY WITH RETROGRADE PYELOGRAM, URETEROSCOPY AND DOUBLE J STENT PLACEMENT;  Surgeon: Kathi Ludwig, MD;  Location: [facility];  Service: Urology;  Laterality: Left;  . CYSTOSCOPY WITH RETROGRADE PYELOGRAM, URETEROSCOPY AND STENT PLACEMENT Left 04/25/2014   Procedure: CYSTOSCOPY WITH RETROGRADE PYELOGRAM, URETEROSCOPY AND STENT REPLACEMENT. ENDOPYELOTOMY;  Surgeon: Sebastian Ache, MD;  Location: [facility];  Service: Urology;  Laterality: Left;  . EXTRACORPOREAL SHOCK WAVE LITHOTRIPSY  x7  last one 2010  . HOLMIUM LASER APPLICATION Left 04/06/2014   Procedure: HOLMIUM LASER APPLICATION;  Surgeon: Kathi Ludwig, MD;  Location: [facility] [facility];  Service: Urology;  Laterality: Left;  . HOLMIUM LASER APPLICATION Left 04/25/2014   Procedure: HOLMIUM LASER APPLICATION;  Surgeon: Sebastian Ache, MD;  [facility];  Service: Urology;  Laterality: Left;  . SLEEP STUDY  2005 (APPROX)   NEGATIVE    Current Outpatient Prescriptions  Medication Sig Dispense Refill  . alfuzosin (UROXATRAL) 10 MG 24 hr tablet Take 10 mg by mouth daily with breakfast.    . citalopram (CELEXA) 40 MG tablet Take 40 mg by mouth daily.    . COCONUT OIL PO Take 1 capsule by mouth daily.    . finasteride (PROSCAR) 5 MG tablet Take 5 mg by mouth daily.    Marland Kitchen lisinopril (PRINIVIL,ZESTRIL) 20 MG tablet Take 20 mg by mouth daily.     No current facility-administered medications for this visit.     Allergies as of  07/14/2016 - Review Complete 07/14/2016  Allergen Reaction Noted  . Contrast media [iodinated diagnostic agents] Anaphylaxis and Hives 04/04/2014  . Iodine Anaphylaxis and Hives 05/31/2012  . Shellfish allergy Hives 04/04/2014    Vitals: BP (!) 149/78   Pulse 71   Ht  (1.753 m)   Wt 214 lb (97.1 kg)   BMI 31.60 kg/m  Last Weight:  Wt Readings from Last 1 Encounters:  07/14/16 214 lb (97.1 kg)   Last Height:   Ht Readings from Last 1 Encounters:  07/14/16  (1.753 m)    Physical exam: Exam: Gen: NAD, conversant, well nourised, obese, well groomed                     CV: RRR, no MRG. No Carotid Bruits. No peripheral edema, warm, nontender Eyes: Conjunctivae clear without exudates or hemorrhage  Neuro: Detailed Neurologic Exam  Speech:    Speech is normal; fluent and spontaneous with normal comprehension.  Cognition:    The patient is oriented to person, place, and time;     recent and remote memory intact;     language fluent;     normal attention, concentration,     fund of knowledge Cranial Nerves:    The pupils are equal, round, and reactive to light. The fundi are flat without edema. Visual fields are full to finger confrontation. Extraocular movements are intact. Trigeminal sensation is intact and the muscles of mastication are normal. The face is symmetric. The palate elevates in the midline. Hearing intact. Voice is normal. Shoulder shrug is normal. The tongue has normal motion without fasciculations.   Coordination:    Normal finger to nose and heel to shin.   Gait:    Not ataxic  Motor Observation:    No asymmetry, no atrophy, and no involuntary movements noted. Tone:    Normal muscle tone.    Posture:    Posture is normal. normal erect    Strength:Decr right grip. Otherwise strength is V/V in the upper and lower limbs.      Sensation: intact to LT     Reflex Exam:  DTR's:    Deep tendon reflexes in the upper and lower extremities are  brisk bilaterally.   Toes:    The toes are equivocal bilaterally.   Clonus:    Clonus is absent.      Assessment/Plan:  60 year old male with chronic neck pain, multilevel degenerative disease, status post several cervical surgeries last was in August 2014 with cervical laminectomy C7-T1, last MRI predates his last surgery was completed in 01/2013 showed  C4-T1 ACDF with severe bilateral C7-T1 neural foraminal narrowing without spinal canal stenosis.  Surgeries include: 07/2013: C7-T1 Posterior cervical hemilaminectomy and foraminotomy   2009: C4-5 anterior cervical diskectomy with structural cage and plating  2012: C7-T1 anterior cervical diskectomy with fusion  As far as diagnostic testing: Repeat MRI cervical spine, pain management with Dr. Ethelene Hal  As far as your medications are concerned, I would like to suggest: start neurontin at night. Do not take while driving motorcycle until you know how it affects you can cause sedation. Medrol dosepak for one week. Stop advil use due to risk of GI bleeds. Take prednisone in the morning with food. Watch for dark stool, gi upset, go directly to ED if GI side effects. Discussed side effects of both medications with patient as per patient instructions.   Lyrica might be better, if patient does not improve with Neurontin can start Lyrica, also recommend physical therapy possibly at [facility] will defer to Dr. Ethelene Hal.      Naomie Dean, MD  [facility] [address] [place], [place] [address]  Phone [phone] Fax [phone]

## 2016-07-14 NOTE — Patient Instructions (Addendum)
Remember to drink plenty of fluid, eat healthy meals and do not skip any meals. Try to eat protein with a every meal and eat a healthy snack such as fruit or nuts in between meals. Try to keep a regular sleep-wake schedule and try to exercise daily, particularly in the form of walking, 20-30 minutes a day, if you can.   As far as your medications are concerned, I would like to suggest: start neurontin at night. Do not take while driving motorcycle until you know how it affects you can cause sedation. Medrol dosepak for one week. Stop advil use due to risk of GI bleeds. Take prednisone in the morning with food. Watch for dark stool, gi upset, go directly to ED if GI side effects.  As far as diagnostic testing: MRI cervical spine, pain management  I would like to see you back if needed, sooner if we need to. Please call us with any interim questions, concerns, problems, updates or refill requests.    Our phone number is [phone]. We also have an after hours call service for urgent matters and there is a physician on-call for urgent questions. For any emergencies you know to call 911 or go to the nearest emergency room   Gabapentin capsules or tablets What is this medicine? GABAPENTIN (GA ba pen tin) is used to control partial seizures in adults with epilepsy. It is also used to treat certain types of nerve pain. This medicine may be used for other purposes; ask your health care provider or pharmacist if you have questions. What should I tell my health care provider before I take this medicine? They need to know if you have any of these conditions: -kidney disease -suicidal thoughts, plans, or attempt; a previous suicide attempt by you or a family member -an unusual or allergic reaction to gabapentin, other medicines, foods, dyes, or preservatives -pregnant or trying to get pregnant -breast-feeding How should I use this medicine? Take this medicine by mouth with a glass of water. Follow the  directions on the prescription label. You can take it with or without food. If it upsets your stomach, take it with food.Take your medicine at regular intervals. Do not take it more often than directed. Do not stop taking except on your doctor's advice. If you are directed to break the 600 or 800 mg tablets in half as part of your dose, the extra half tablet should be used for the next dose. If you have not used the extra half tablet within 28 days, it should be thrown away. A special MedGuide will be given to you by the pharmacist with each prescription and refill. Be sure to read this information carefully each time. Talk to your pediatrician regarding the use of this medicine in children. Special care may be needed. Overdosage: If you think you have taken too much of this medicine contact a poison control center or emergency room at once. NOTE: This medicine is only for you. Do not share this medicine with others. What if I miss a dose? If you miss a dose, take it as soon as you can. If it is almost time for your next dose, take only that dose. Do not take double or extra doses. What may interact with this medicine? Do not take this medicine with any of the following medications: -other gabapentin products This medicine may also interact with the following medications: -alcohol -antacids -antihistamines for allergy, cough and cold -certain medicines for anxiety or sleep -certain medicines for  depression or psychotic disturbances -homatropine; hydrocodone -naproxen -narcotic medicines (opiates) for pain -phenothiazines like chlorpromazine, mesoridazine, prochlorperazine, thioridazine This list may not describe all possible interactions. Give your health care provider a list of all the medicines, herbs, non-prescription drugs, or dietary supplements you use. Also tell them if you smoke, drink alcohol, or use illegal drugs. Some items may interact with your medicine. What should I watch for  while using this medicine? Visit your doctor or health care professional for regular checks on your progress. You may want to keep a record at home of how you feel your condition is responding to treatment. You may want to share this information with your doctor or health care professional at each visit. You should contact your doctor or health care professional if your seizures get worse or if you have any new types of seizures. Do not stop taking this medicine or any of your seizure medicines unless instructed by your doctor or health care professional. Stopping your medicine suddenly can increase your seizures or their severity. Wear a medical identification bracelet or chain if you are taking this medicine for seizures, and carry a card that lists all your medications. You may get drowsy, dizzy, or have blurred vision. Do not drive, use machinery, or do anything that needs mental alertness until you know how this medicine affects you. To reduce dizzy or fainting spells, do not sit or stand up quickly, especially if you are an older patient. Alcohol can increase drowsiness and dizziness. Avoid alcoholic drinks. Your mouth may get dry. Chewing sugarless gum or sucking hard candy, and drinking plenty of water will help. The use of this medicine may increase the chance of suicidal thoughts or actions. Pay special attention to how you are responding while on this medicine. Any worsening of mood, or thoughts of suicide or dying should be reported to your health care professional right away. Women who become pregnant while using this medicine may enroll in the [place] American Antiepileptic Drug Pregnancy Registry by calling ([phone]. This registry collects information about the safety of antiepileptic drug use during pregnancy. What side effects may I notice from receiving this medicine? Side effects that you should report to your doctor or health care professional as soon as possible: -allergic reactions  like skin rash, itching or hives, swelling of the face, lips, or tongue -worsening of mood, thoughts or actions of suicide or dying Side effects that usually do not require medical attention (report to your doctor or health care professional if they continue or are bothersome): -constipation -difficulty walking or controlling muscle movements -dizziness -nausea -slurred speech -tiredness -tremors -weight gain This list may not describe all possible side effects. Call your doctor for medical advice about side effects. You may report side effects to FDA at 1-800-FDA-1088. Where should I keep my medicine? Keep out of reach of children. This medicine may cause accidental overdose and death if it taken by other adults, children, or pets. Mix any unused medicine with a substance like cat litter or coffee grounds. Then throw the medicine away in a sealed container like a sealed bag or a coffee can with a lid. Do not use the medicine after the expiration date. Store at room temperature between 15 and 30 degrees C (59 and 86 degrees F). NOTE: This sheet is a summary. It may not cover all possible information. If you have questions about this medicine, talk to your doctor, pharmacist, or health care provider.    2016, Elsevier/Gold Standard. (2014-02-02 15:26:50)  Methylprednisolone tablets What is this medicine? METHYLPREDNISOLONE (meth ill pred NISS oh lone) is a corticosteroid. It is commonly used to treat inflammation of the skin, joints, lungs, and other organs. Common conditions treated include asthma, allergies, and arthritis. It is also used for other conditions, such as blood disorders and diseases of the adrenal glands. This medicine may be used for other purposes; ask your health care provider or pharmacist if you have questions. What should I tell my health care provider before I take this medicine? They need to know if you have any of these conditions: -Cushing's  syndrome -diabetes -glaucoma -heart problems or disease -high blood pressure -infection such as herpes, measles, tuberculosis, or chickenpox -kidney disease -liver disease -mental problems -myasthenia gravis -osteoporosis -seizures -stomach ulcer or intestine disease including colitis and diverticulitis -thyroid problem -an unusual or allergic reaction to lactose, methylprednisolone, other medicines, foods, dyes, or preservatives -pregnant or trying to get pregnant -breast-feeding How should I use this medicine? Take this medicine by mouth with a drink of water. Follow the directions on the prescription label. Take it with food or milk to avoid stomach upset. If you are taking this medicine once a day, take it in the morning. Do not take more medicine than you are told to take. Do not suddenly stop taking your medicine because you may develop a severe reaction. Your doctor will tell you how much medicine to take. If your doctor wants you to stop the medicine, the dose may be slowly lowered over time to avoid any side effects. Talk to your pediatrician regarding the use of this medicine in children. Special care may be needed. Overdosage: If you think you have taken too much of this medicine contact a poison control center or emergency room at once. NOTE: This medicine is only for you. Do not share this medicine with others. What if I miss a dose? If you miss a dose, take it as soon as you can. If it is almost time for your next dose, talk to your doctor or health care professional. You may need to miss a dose or take an extra dose. Do not take double or extra doses without advice. What may interact with this medicine? Do not take this medicine with any of the following medications: -mifepristone This medicine may also interact with the following medications: -tacrolimus -vaccines -warfarin This list may not describe all possible interactions. Give your health care provider a list of  all the medicines, herbs, non-prescription drugs, or dietary supplements you use. Also tell them if you smoke, drink alcohol, or use illegal drugs. Some items may interact with your medicine. What should I watch for while using this medicine? Visit your doctor or health care professional for regular checks on your progress. If you are taking this medicine for a long time, carry an identification card with your name and address, the type and dose of your medicine, and your doctor's name and address. The medicine may increase your risk of getting an infection. Stay away from people who are sick. Tell your doctor or health care professional if you are around anyone with measles or chickenpox. If you are going to have surgery, tell your doctor or health care professional that you have taken this medicine within the last twelve months. Ask your doctor or health care professional about your diet. You may need to lower the amount of salt you eat. The medicine can increase your blood sugar. If you are a diabetic check with your doctor  if you need help adjusting the dose of your diabetic medicine. What side effects may I notice from receiving this medicine? Side effects that you should report to your doctor or health care professional as soon as possible: -allergic reactions like skin rash, itching or hives, swelling of the face, lips, or tongue -eye pain, decreased or blurred vision, or bulging eyes -fever, sore throat, sneezing, cough, or other signs of infection, wounds that will not heal -increased thirst -mental depression, mood swings, mistaken feelings of self importance or of being mistreated -pain in hips, back, ribs, arms, shoulders, or legs -swelling of the ankles, feet, hands -trouble passing urine or change in the amount of urine Side effects that usually do not require medical attention (report to your doctor or health care professional if they continue or are bothersome): -confusion,  excitement, restlessness -headache -nausea, vomiting -skin problems, acne, thin and shiny skin -weight gain This list may not describe all possible side effects. Call your doctor for medical advice about side effects. You may report side effects to FDA at 1-800-FDA-1088. Where should I keep my medicine? Keep out of the reach of children. Store at room temperature between 20 and 25 degrees C (68 and 77 degrees F). Throw away any unused medicine after the expiration date. NOTE: This sheet is a summary. It may not cover all possible information. If you have questions about this medicine, talk to your doctor, pharmacist, or health care provider.    2016, Elsevier/Gold Standard. (2012-09-06 11:38:34)

## 2016-07-14 NOTE — Telephone Encounter (Signed)
Faxed printed rx xanax to pt pharmacy. Fax: [phone]. Received confirmation.

## 2016-07-22 ENCOUNTER — Telehealth: Payer: Self-pay | Admitting: Neurology

## 2016-07-22 NOTE — Telephone Encounter (Signed)
Patient's wife is returning a call regarding the patient. °

## 2016-07-22 NOTE — Telephone Encounter (Signed)
Called patient and left a VM about scheduling MRI.

## 2016-08-04 NOTE — Telephone Encounter (Signed)
Pt's wife called to speak with Duwayne Heckanielle about scheduling MRI appt. Please call

## 2016-08-05 NOTE — Telephone Encounter (Signed)
Spoke with patients wife and scheduled apt.

## 2016-08-11 ENCOUNTER — Other Ambulatory Visit: Payer: Self-pay | Admitting: *Deleted

## 2016-08-11 DIAGNOSIS — M5010 Cervical disc disorder with radiculopathy, unspecified cervical region: Secondary | ICD-10-CM

## 2016-08-11 DIAGNOSIS — M542 Cervicalgia: Principal | ICD-10-CM

## 2016-08-11 DIAGNOSIS — R202 Paresthesia of skin: Secondary | ICD-10-CM

## 2016-08-11 DIAGNOSIS — G8929 Other chronic pain: Secondary | ICD-10-CM

## 2016-08-11 DIAGNOSIS — R29898 Other symptoms and signs involving the musculoskeletal system: Secondary | ICD-10-CM

## 2016-08-12 ENCOUNTER — Ambulatory Visit (INDEPENDENT_AMBULATORY_CARE_PROVIDER_SITE_OTHER): Payer: 59

## 2016-08-12 ENCOUNTER — Telehealth: Payer: Self-pay | Admitting: Neurology

## 2016-08-12 DIAGNOSIS — R202 Paresthesia of skin: Secondary | ICD-10-CM | POA: Diagnosis not present

## 2016-08-12 DIAGNOSIS — R29898 Other symptoms and signs involving the musculoskeletal system: Secondary | ICD-10-CM

## 2016-08-12 DIAGNOSIS — G8929 Other chronic pain: Secondary | ICD-10-CM | POA: Diagnosis not present

## 2016-08-12 DIAGNOSIS — M6289 Other specified disorders of muscle: Secondary | ICD-10-CM

## 2016-08-12 DIAGNOSIS — M542 Cervicalgia: Secondary | ICD-10-CM | POA: Diagnosis not present

## 2016-08-12 DIAGNOSIS — M5010 Cervical disc disorder with radiculopathy, unspecified cervical region: Secondary | ICD-10-CM | POA: Diagnosis not present

## 2016-08-12 NOTE — Telephone Encounter (Signed)
Called to try some Lyrica for pain, can;t get in to pain management until October. He has an appt in October with [facility] where Dr. Loraine LericheMark Philips is located.  Annabelle HarmanDana, can you see if he can get into Dr. Ethelene Halamos sooner than October? Maybe we can send him there instead. thanks

## 2016-08-13 ENCOUNTER — Other Ambulatory Visit: Payer: Self-pay | Admitting: Neurology

## 2016-08-13 NOTE — Telephone Encounter (Signed)
Called Dr. Ethelene Halamos office at [phone] he is booking in October as well. Called Patient he relayed he is going to keep his apt with Dr. Vear ClockPhillips.

## 2016-08-13 NOTE — Telephone Encounter (Signed)
Thanks Scott Burch. I called patient, asked him to call me back if he is interested in trying Lyrica or cymbalta for pain thanks

## 2016-08-18 ENCOUNTER — Telehealth: Payer: Self-pay | Admitting: *Deleted

## 2016-08-18 NOTE — Telephone Encounter (Signed)
Dr Lucia GaskinsAhern- can you send in rx for lyrica?  Pt agreeable to try this.  Called and spoke to pt about MRI results per Dr Lucia GaskinsAhern note. He verbalized understanding. He is willing to try lyrica. I went over common/serious SE of medication. I verified his pharmacy. Advised Dr Lucia GaskinsAhern will send rx to pharmacy. He verbalized understanding and will call if he experiences any SE .

## 2016-08-18 NOTE — Telephone Encounter (Signed)
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 This will help Dr. Vear ClockPhillips' office make a plan for their procedures. His hardware looks in place from previous surgeries. If he would like to try Lyrica for his neck pain I am happy to prescribe thanks.

## 2016-08-19 ENCOUNTER — Other Ambulatory Visit: Payer: Self-pay | Admitting: Neurology

## 2016-08-19 MED ORDER — PREGABALIN 100 MG PO CAPS: 100.0000 mg | ORAL_CAPSULE | Freq: Two times a day (BID) | ORAL | 5 refills | Status: AC

## 2016-08-20 NOTE — Telephone Encounter (Signed)
Faxed printed rx lyrica to pt pharmacy. Fax: [phone]: [phone]. Received confirmation.

## 2016-08-20 NOTE — Telephone Encounter (Signed)
Called pt and LVM . Advised insurance will not cover lyrica. Needs PA. Dr Lucia GaskinsAhern suggested increasing gabapentin and see if this helps. If ineffective, we can try doing PA for lyrica at that time. Gave [facility] phone number for him to call and let us know.

## 2016-08-27 NOTE — Telephone Encounter (Signed)
Called and spoke to pt since no return call from last week. He stated he did get the message. He thought his wife called our office back. He stated he was having really weird dreams from the gabapentin and wondering if we can try and submit PA for lyrica to try and get it approved. I advised that it is not guaranteed that it will be approved. He verbalized understanding and requested I also call his wife to let her know. I will call once we receive response from his insurance company.   I called wife Scott Burch and relayed message above. She verbalized understanding and has no further questions at this time.

## 2016-08-28 NOTE — Telephone Encounter (Signed)
Called and spoke to pt. Advised I was working on his PA for lyrica and they need to know when he took amitriptyline and for how long. He was not sure of this information.  He is going to look this up and call back and let me know. He stopped taking gabapentin about a week ago all together.

## 2016-09-01 ENCOUNTER — Telehealth: Payer: Self-pay | Admitting: *Deleted

## 2016-09-01 NOTE — Telephone Encounter (Addendum)
Submitted PA on covermy meds and received notice: PA Case [id number] is Approved Key on covermymeds: [id number]  [facility] that lyrica approved as well. Approved until 09/01/2017 or until coverage for medication is no longer available under the benefit plan or the medication becomes subject to a pharmacy benefit coverage requirement, such as supply limits or notification whichever occurs first.   Called pt and advised him that PA approved. He verbalized understanding.

## 2016-09-01 NOTE — Telephone Encounter (Signed)
Error

## 2016-09-01 NOTE — Telephone Encounter (Signed)
Called patient again since no return call. He stated he tried calling Friday but was on hold and had to go to a meeting. It was after 12pm when his meeting got finished.  He stated he took amitriptyline about 7-8 yr ago for about 3-4 months.  Advised I will finish up his PA and keep him up to date. He verbalized understanding.

## 2018-02-14 ENCOUNTER — Other Ambulatory Visit: Payer: Self-pay | Admitting: Otolaryngology

## 2018-02-14 DIAGNOSIS — R221 Localized swelling, mass and lump, neck: Secondary | ICD-10-CM

## 2018-03-02 ENCOUNTER — Ambulatory Visit
Admission: RE | Admit: 2018-03-02 | Discharge: 2018-03-02 | Disposition: A | Discharge status: Home / Self Care | Payer: 59 | Source: Ambulatory Visit | Attending: Otolaryngology | Admitting: Otolaryngology

## 2018-03-02 DIAGNOSIS — R221 Localized swelling, mass and lump, neck: Secondary | ICD-10-CM | POA: Diagnosis present

## 2018-03-02 LAB — POCT I-STAT CREATININE: CREATININE: 0.9 mg/dL (ref 0.61–1.24)

## 2018-03-02 MED ORDER — IOPAMIDOL (ISOVUE-300) INJECTION 61%
75.0000 mL | Freq: Once | INTRAVENOUS | Status: AC | PRN
  Administered 2018-03-02: 75 mL via INTRAVENOUS

## 2018-03-07 ENCOUNTER — Other Ambulatory Visit: Payer: Self-pay | Admitting: Otolaryngology

## 2018-03-07 DIAGNOSIS — E041 Nontoxic single thyroid nodule: Secondary | ICD-10-CM

## 2018-05-11 ENCOUNTER — Ambulatory Visit: Payer: 59 | Admitting: Certified Registered Nurse Anesthetist

## 2018-05-11 ENCOUNTER — Other Ambulatory Visit: Payer: Self-pay

## 2018-05-11 ENCOUNTER — Encounter
Admission: RE | Disposition: A | Discharge status: Home / Self Care | Payer: Self-pay | Source: Ambulatory Visit | Attending: Unknown Physician Specialty

## 2018-05-11 ENCOUNTER — Encounter: Payer: Self-pay | Admitting: Certified Registered Nurse Anesthetist

## 2018-05-11 ENCOUNTER — Ambulatory Visit
Admission: RE | Admit: 2018-05-11 | Discharge: 2018-05-11 | Disposition: A | Discharge status: Home / Self Care | Payer: 59 | Source: Ambulatory Visit | Attending: Unknown Physician Specialty | Admitting: Unknown Physician Specialty

## 2018-05-11 DIAGNOSIS — Z951 Presence of aortocoronary bypass graft: Secondary | ICD-10-CM | POA: Insufficient documentation

## 2018-05-11 DIAGNOSIS — K644 Residual hemorrhoidal skin tags: Secondary | ICD-10-CM | POA: Insufficient documentation

## 2018-05-11 DIAGNOSIS — K640 First degree hemorrhoids: Secondary | ICD-10-CM | POA: Diagnosis not present

## 2018-05-11 DIAGNOSIS — F329 Major depressive disorder, single episode, unspecified: Secondary | ICD-10-CM | POA: Insufficient documentation

## 2018-05-11 DIAGNOSIS — Z79890 Hormone replacement therapy (postmenopausal): Secondary | ICD-10-CM | POA: Diagnosis not present

## 2018-05-11 DIAGNOSIS — I10 Essential (primary) hypertension: Secondary | ICD-10-CM | POA: Insufficient documentation

## 2018-05-11 DIAGNOSIS — Z7982 Long term (current) use of aspirin: Secondary | ICD-10-CM | POA: Insufficient documentation

## 2018-05-11 DIAGNOSIS — F419 Anxiety disorder, unspecified: Secondary | ICD-10-CM | POA: Diagnosis not present

## 2018-05-11 DIAGNOSIS — Z8673 Personal history of transient ischemic attack (TIA), and cerebral infarction without residual deficits: Secondary | ICD-10-CM | POA: Diagnosis not present

## 2018-05-11 DIAGNOSIS — Z1211 Encounter for screening for malignant neoplasm of colon: Secondary | ICD-10-CM | POA: Diagnosis present

## 2018-05-11 DIAGNOSIS — Z79899 Other long term (current) drug therapy: Secondary | ICD-10-CM | POA: Diagnosis not present

## 2018-05-11 DIAGNOSIS — N400 Enlarged prostate without lower urinary tract symptoms: Secondary | ICD-10-CM | POA: Insufficient documentation

## 2018-05-11 HISTORY — DX: Cardiac murmur, unspecified: R01.1

## 2018-05-11 HISTORY — DX: Dysphagia, unspecified: R13.10

## 2018-05-11 HISTORY — DX: Headache: R51

## 2018-05-11 HISTORY — DX: Pain in leg, unspecified: M79.606

## 2018-05-11 HISTORY — DX: Partial anomalous pulmonary venous connection: Q26.3

## 2018-05-11 HISTORY — DX: Transient cerebral ischemic attack, unspecified: G45.9

## 2018-05-11 HISTORY — DX: Other cervical disc degeneration, unspecified cervical region: M50.30

## 2018-05-11 HISTORY — DX: Major depressive disorder, single episode, unspecified: F32.9

## 2018-05-11 HISTORY — PX: COLONOSCOPY WITH PROPOFOL: SHX5780

## 2018-05-11 HISTORY — DX: Depression, unspecified: F32.A

## 2018-05-11 HISTORY — DX: Anxiety disorder, unspecified: F41.9

## 2018-05-11 HISTORY — DX: Cervicalgia: M54.2

## 2018-05-11 HISTORY — DX: Gastro-esophageal reflux disease without esophagitis: K21.9

## 2018-05-11 HISTORY — DX: Other chronic pain: G89.29

## 2018-05-11 HISTORY — DX: Headache, unspecified: R51.9

## 2018-05-11 SURGERY — COLONOSCOPY WITH PROPOFOL
Anesthesia: General

## 2018-05-11 MED ORDER — PROPOFOL 10 MG/ML IV BOLUS
INTRAVENOUS | Status: DC | PRN
  Administered 2018-05-11: 100 mg via INTRAVENOUS
  Administered 2018-05-11: 24 mg via INTRAVENOUS

## 2018-05-11 MED ORDER — PROPOFOL 500 MG/50ML IV EMUL
INTRAVENOUS | Status: DC | PRN
  Administered 2018-05-11: 140 ug/kg/min via INTRAVENOUS

## 2018-05-11 MED ORDER — PROPOFOL 500 MG/50ML IV EMUL
INTRAVENOUS | Status: AC
  Filled 2018-05-11: qty 50

## 2018-05-11 MED ORDER — LIDOCAINE HCL (CARDIAC) PF 100 MG/5ML IV SOSY
PREFILLED_SYRINGE | INTRAVENOUS | Status: DC | PRN
  Administered 2018-05-11: 50 mg via INTRAVENOUS

## 2018-05-11 MED ORDER — LIDOCAINE HCL (PF) 2 % IJ SOLN
INTRAMUSCULAR | Status: AC
  Filled 2018-05-11: qty 10

## 2018-05-11 MED ORDER — SODIUM CHLORIDE 0.9 % IV SOLN
INTRAVENOUS | Status: DC
  Administered 2018-05-11: 11:00:00 via INTRAVENOUS

## 2018-05-11 MED ORDER — SODIUM CHLORIDE 0.9 % IV SOLN: INTRAVENOUS | Status: DC

## 2018-05-11 NOTE — H&P (Signed)
Primary Care Physician:  Barbette Reichmann, MD Primary Gastroenterologist:  Dr. Mechele Collin  Pre-Procedure History & Physical: HPI:  Scott Burch is a 62 y.o. male is here for an colonoscopy.  This is for screening.   Past Medical History:  Diagnosis Date  . Anxiety   . BPH (benign prostatic hypertrophy)   . Chronic leg pain   . Chronic neck pain   . DDD (degenerative disc disease), cervical   . Degenerative disc disease, cervical   . Depression   . Dysphagia   . Frequency of urination   . GERD (gastroesophageal reflux disease)   . Headache   . Heart murmur   . History of atrial septal defect repair    CARDIOLOGIST --  DR Earma Reading  AT [facility] (LOV 11/2013)  . History of atrial septal defect repair   . History of kidney stones   . Hyperlipidemia   . Hypertension   . LBBB (left bundle branch block)   . Left ureteral calculus   . Partial anomalous pulmonary venous return    h/o  . TIA (transient ischemic attack)   . Urgency of urination     Past Surgical History:  Procedure Laterality Date  . ASD REPAIR  2000   [facility]  . BACK SURGERY    . CERVICAL FUSION  X2   C3 -- C4  &  C4 -- C5  . CERVICAL LAMINECTOMY  07/2013   C6 -- 7  . COLONOSCOPY    . COLONOSCOPY    . CORONARY ARTERY BYPASS GRAFT    . CYSTO/  LEFT RETROGRADE PYELOGRAM/  LEFT URETER DILATION/  STENT PLACEMENT  06-21-2000  . CYSTO/  LEFT URETEROSCOPY/  STENT PLACEMENT  01-15-2006  . CYSTOSCOPY WITH RETROGRADE PYELOGRAM, URETEROSCOPY AND STENT PLACEMENT Left 04/06/2014   Procedure: CYSTOSCOPY WITH LEFT RETROGRADE PYELOGRAM, LEFT URETEROSCOPY, STONE EXTRACTION , DOUBLE J STENT PLACEMENT;  Surgeon: Kathi Ludwig, MD;  Location: [facility];  Service: Urology;  Laterality: Left;  . CYSTOSCOPY WITH RETROGRADE PYELOGRAM, URETEROSCOPY AND STENT PLACEMENT Left 04/10/2014   Procedure: CYSTOSCOPY WITH RETROGRADE PYELOGRAM, URETEROSCOPY AND DOUBLE J STENT PLACEMENT;  Surgeon: Kathi Ludwig, MD;   Location: [facility];  Service: Urology;  Laterality: Left;  . CYSTOSCOPY WITH RETROGRADE PYELOGRAM, URETEROSCOPY AND STENT PLACEMENT Left 04/25/2014   Procedure: CYSTOSCOPY WITH RETROGRADE PYELOGRAM, URETEROSCOPY AND STENT REPLACEMENT. ENDOPYELOTOMY;  Surgeon: Sebastian Ache, MD;  Location: [facility];  Service: Urology;  Laterality: Left;  . EXTRACORPOREAL SHOCK WAVE LITHOTRIPSY  x7  last one 2010  . HOLMIUM LASER APPLICATION Left 04/06/2014   Procedure: HOLMIUM LASER APPLICATION;  Surgeon: Kathi Ludwig, MD;  Location: [facility];  Service: Urology;  Laterality: Left;  . HOLMIUM LASER APPLICATION Left 04/25/2014   Procedure: HOLMIUM LASER APPLICATION;  Surgeon: Sebastian Ache, MD;  Location: [facility];  Service: Urology;  Laterality: Left;  . SLEEP STUDY  2005 (APPROX)   NEGATIVE    Prior to Admission medications   Medication Sig Start Date End Date Taking? Authorizing Provider  alfuzosin (UROXATRAL) 10 MG 24 hr tablet Take 10 mg by mouth daily with breakfast.   Yes [provider]  aspirin EC 81 MG tablet Take 81 mg by mouth daily.   Yes [provider]  citalopram (CELEXA) 40 MG tablet Take 40 mg by mouth daily.   Yes [provider]  Clobetasol Propionate (CLOBEX) 0.05 % lotion Apply topically 2 (two) times daily.   Yes  [provider]  COCONUT OIL PO Take 1 capsule by mouth daily.   Yes [provider]  cyanocobalamin 1000 MCG tablet Take 1,000 mcg by mouth daily.   Yes [provider]  ergocalciferol (VITAMIN D2) 50000 units capsule Take 50,000 Units by mouth once a week.   Yes [provider]  finasteride (PROSCAR) 5 MG tablet Take 5 mg by mouth daily.   Yes [provider]  gabapentin (NEURONTIN) 300 MG capsule Take 1 capsule (300 mg total) by mouth 3 (three) times daily. Start with one pill at night, do not drive if taking during the day. 07/14/16  Yes Anson Fret, MD  lisinopril (PRINIVIL,ZESTRIL) 20 MG tablet Take 20 mg by mouth daily.   Yes [provider]  losartan (COZAAR) 50 MG tablet Take 50 mg by mouth daily.   Yes [provider]  potassium citrate (UROCIT-K) 10 MEQ (1080 MG) SR tablet Take 15 mEq by mouth daily.   Yes [provider]  pregabalin (LYRICA) 100 MG capsule Take 1 capsule (100 mg total) by mouth 2 (two) times daily. 08/19/16  Yes Anson Fret, MD  testosterone cypionate (DEPOTESTOTERONE CYPIONATE) 100 MG/ML injection Inject 100 mg into the muscle every 14 (fourteen) days. For IM use only   Yes [provider]  ALPRAZolam (XANAX) 0.5 MG tablet Take 1-2 30 minutes before MRI. May take an additional one as needed. Do not drive. Patient not taking: Reported on 05/11/2018 07/14/16   Anson Fret, MD  methylPREDNISolone (MEDROL DOSEPAK) 4 MG TBPK tablet follow package directions Patient not taking: Reported on 05/11/2018 07/14/16   Anson Fret, MD    Allergies as of 04/13/2018 - Review Complete 07/14/2016  Allergen Reaction Noted  . Contrast media [iodinated diagnostic agents] Anaphylaxis and Hives 04/04/2014  . Iodine Anaphylaxis and Hives 05/31/2012  . Shellfish allergy Hives 04/04/2014    History reviewed. No pertinent family history.  Social History   Socioeconomic History  . Marital status: Married    Spouse name: Gwen  . Number of children: Not on file  . Years of education: Not on file  . Highest education level: Not on file  Occupational History  . Occupation: Psychiatric nurse   Social Needs  . Financial resource strain: Not on file  . Food insecurity:    Worry: Not on file    Inability: Not on file  . Transportation needs:    Medical: Not on file    Non-medical: Not on file  Tobacco Use  . Smoking status: Never Smoker  . Smokeless tobacco: Never Used  Substance and Sexual Activity  . Alcohol use: Yes    Alcohol/week: 3.0 oz    Types: 2 Cans of beer, 3  Shots of liquor per week    Comment: weekend  . Drug use: No  . Sexual activity: Not on file  Lifestyle  . Physical activity:    Days per week: Not on file    Minutes per session: Not on file  . Stress: Not on file  Relationships  . Social connections:    Talks on phone: Not on file    Gets together: Not on file    Attends religious service: Not on file    Active member of club or organization: Not on file    Attends meetings of clubs or organizations: Not on file    Relationship status: Not on file  . Intimate partner violence:    Fear of current  or ex partner: Not on file    Emotionally abused: Not on file    Physically abused: Not on file    Forced sexual activity: Not on file  Other Topics Concern  . Not on file  Social History Narrative   Lives with wife      Caffeine use: 1-2 small mountain dew per day    Review of Systems: See HPI, otherwise negative ROS  Physical Exam: BP 139/85   Pulse (!) 51   Temp 98.2 F (36.8 C) (Oral)   Resp 18   Ht  (1.753 m)   Wt 97.1 kg (214 lb)   SpO2 99%   BMI 31.60 kg/m  General:   Alert,  pleasant and cooperative in NAD Head:  Normocephalic and atraumatic. Neck:  Supple; no masses or thyromegaly. Lungs:  Clear throughout to auscultation.    Heart:  Regular rate and rhythm. Abdomen:  Soft, nontender and nondistended. Normal bowel sounds, without guarding, and without rebound.   Neurologic:  Alert and  oriented x4;  grossly normal neurologically.  Impression/Plan: Scott Burch is here for an colonoscopy to be performed for screening.  Risks, benefits, limitations, and alternatives regarding  colonoscopy have been reviewed with the patient.  Questions have been answered.  All parties agreeable.   Lynnae Prude, MD  05/11/2018, 10:54 AM

## 2018-05-11 NOTE — Anesthesia Post-op Follow-up Note (Signed)
Anesthesia QCDR form completed.        

## 2018-05-11 NOTE — Anesthesia Preprocedure Evaluation (Addendum)
Anesthesia Evaluation  Patient identified by MRN, date of birth, ID band Patient awake    Reviewed: Allergy & Precautions, H&P , NPO status , Patient's Chart, lab work & pertinent test results  History of Anesthesia Complications Negative for: history of anesthetic complications  Airway Mallampati: III  TM Distance: >3 FB Neck ROM: Full    Dental  (+) Teeth Intact, Dental Advisory Given, Caps Permanent bridge on the top:   Pulmonary neg pulmonary ROS,           Cardiovascular Exercise Tolerance: Good hypertension, Pt. on medications (-) angina(-) CAD, (-) Past MI, (-) Cardiac Stents and (-) CABG + dysrhythmias (LBBB) + Valvular Problems/Murmurs   25-Apr-2014  Normal sinus rhythm Left bundle branch block   Neuro/Psych neg Seizures PSYCHIATRIC DISORDERS Anxiety Depression TIA   GI/Hepatic Neg liver ROS, GERD  ,  Endo/Other  negative endocrine ROS  Renal/GU Renal disease (kidney stones)     Musculoskeletal negative musculoskeletal ROS (+) S/P ACDF   Abdominal   Peds  Hematology negative hematology ROS (+)   Anesthesia Other Findings Past Medical History: No date: Anxiety No date: BPH (benign prostatic hypertrophy) No date: Chronic leg pain No date: Chronic neck pain No date: DDD (degenerative disc disease), cervical No date: Degenerative disc disease, cervical No date: Depression No date: Dysphagia No date: Frequency of urination No date: GERD (gastroesophageal reflux disease) No date: Headache No date: Heart murmur No date: History of atrial septal defect repair     Comment:  CARDIOLOGIST --  DR Earma Reading  AT [facility] (LOV 11/2013) No date: History of atrial septal defect repair No date: History of kidney stones No date: Hyperlipidemia No date: Hypertension No date: LBBB (left bundle branch block) No date: Left ureteral calculus No date: Partial anomalous pulmonary venous return     Comment:  h/o No date:  TIA (transient ischemic attack) No date: Urgency of urination   Reproductive/Obstetrics negative OB ROS                            Anesthesia Physical  Anesthesia Plan  ASA: II  Anesthesia Plan: General   Post-op Pain Management:    Induction: Intravenous  PONV Risk Score and Plan: 2 and Propofol infusion  Airway Management Planned: Nasal Cannula  Additional Equipment:   Intra-op Plan:   Post-operative Plan:   Informed Consent:   Dental advisory given  Plan Discussed with: CRNA and Anesthesiologist  Anesthesia Plan Comments:         Anesthesia Quick Evaluation                                  Anesthesia Evaluation  Patient identified by MRN, date of birth, ID band Patient awake    Reviewed: Allergy & Precautions, H&P , NPO status , Patient's Chart, lab work & pertinent test results  Airway Mallampati: II TM Distance: >3 FB Neck ROM: Full    Dental  (+) Dental Advisory Given   Pulmonary neg pulmonary ROS,  breath sounds clear to auscultation        Cardiovascular hypertension, Pt. on medications + dysrhythmias Rhythm:Regular Rate:Normal     Neuro/Psych negative neurological ROS  negative psych ROS   GI/Hepatic negative GI ROS, Neg liver ROS,   Endo/Other  negative endocrine ROS  Renal/GU Renal diseasenegative Renal ROS     Musculoskeletal negative musculoskeletal ROS (+)  Abdominal   Peds  Hematology negative hematology ROS (+)   Anesthesia Other Findings   Reproductive/Obstetrics                           Anesthesia Physical  Anesthesia Plan  ASA: II  Anesthesia Plan: General   Post-op Pain Management:    Induction: Intravenous  Airway Management Planned: LMA  Additional Equipment:   Intra-op Plan:   Post-operative Plan: Extubation in OR  Informed Consent: I have reviewed the patients History and Physical, chart, labs and discussed the procedure  including the risks, benefits and alternatives for the proposed anesthesia with the patient or authorized representative who has indicated his/her understanding and acceptance.   Dental advisory given  Plan Discussed with: CRNA  Anesthesia Plan Comments:         Anesthesia Quick Evaluation                                   Anesthesia Evaluation  Patient identified by MRN, date of birth, ID band Patient awake    Reviewed: Allergy & Precautions, H&P , NPO status , Patient's Chart, lab work & pertinent test results  Airway Mallampati: II TM Distance: >3 FB Neck ROM: Full    Dental  (+) Dental Advisory Given   Pulmonary neg pulmonary ROS,  breath sounds clear to auscultation        Cardiovascular hypertension, Pt. on medications + dysrhythmias Rhythm:Regular Rate:Normal     Neuro/Psych negative neurological ROS  negative psych ROS   GI/Hepatic negative GI ROS, Neg liver ROS,   Endo/Other  negative endocrine ROS  Renal/GU Renal diseasenegative Renal ROS     Musculoskeletal negative musculoskeletal ROS (+)   Abdominal   Peds  Hematology negative hematology ROS (+)   Anesthesia Other Findings   Reproductive/Obstetrics                           Anesthesia Physical Anesthesia Plan  ASA: II  Anesthesia Plan: General   Post-op Pain Management:    Induction: Intravenous  Airway Management Planned: LMA  Additional Equipment:   Intra-op Plan:   Post-operative Plan: Extubation in OR  Informed Consent: I have reviewed the patients History and Physical, chart, labs and discussed the procedure including the risks, benefits and alternatives for the proposed anesthesia with the patient or authorized representative who has indicated his/her understanding and acceptance.   Dental advisory given  Plan Discussed with: CRNA  Anesthesia Plan Comments:         Anesthesia Quick Evaluation                                    Anesthesia Evaluation  Patient identified by MRN, date of birth, ID band Patient awake    Reviewed: Allergy & Precautions, H&P , NPO status , Patient's Chart, lab work & pertinent test results  Airway Mallampati: II TM Distance: >3 FB Neck ROM: Full    Dental  (+) Dental Advisory Given   Pulmonary neg pulmonary ROS,  breath sounds clear to auscultation        Cardiovascular hypertension, Pt. on medications + dysrhythmias Rhythm:Regular Rate:Normal     Neuro/Psych negative neurological ROS  negative psych ROS   GI/Hepatic negative GI ROS, Neg liver ROS,  Endo/Other  negative endocrine ROS  Renal/GU Renal diseasenegative Renal ROS     Musculoskeletal negative musculoskeletal ROS (+)   Abdominal   Peds  Hematology negative hematology ROS (+)   Anesthesia Other Findings   Reproductive/Obstetrics                           Anesthesia Physical Anesthesia Plan  ASA: II  Anesthesia Plan: General   Post-op Pain Management:    Induction: Intravenous  Airway Management Planned: LMA  Additional Equipment:   Intra-op Plan:   Post-operative Plan: Extubation in OR  Informed Consent: I have reviewed the patients History and Physical, chart, labs and discussed the procedure including the risks, benefits and alternatives for the proposed anesthesia with the patient or authorized representative who has indicated his/her understanding and acceptance.   Dental advisory given  Plan Discussed with: CRNA  Anesthesia Plan Comments:         Anesthesia Quick Evaluation

## 2018-05-11 NOTE — Op Note (Signed)
[facility] Gastroenterology Patient Name: Scott Burch Procedure Date: 05/11/2018 10:55 AM MRN: [id number] Account #: [id number] Date of Birth: [birth year] Admit Type: Outpatient Age: 62 Room: [facility] ENDO ROOM 3 Gender: Male Note Status: Finalized Procedure:            Colonoscopy Indications:          Screening for colorectal malignant neoplasm Providers:            Scot Jun, MD Referring MD:         Barbette Reichmann, MD (Referring MD) Medicines:            Propofol per Anesthesia Complications:        No immediate complications. Procedure:            Pre-Anesthesia Assessment:                       - After reviewing the risks and benefits, the patient                        was deemed in satisfactory condition to undergo the                        procedure.                       After obtaining informed consent, the colonoscope was                        passed under direct vision. Throughout the procedure,                        the patient's blood pressure, pulse, and oxygen                        saturations were monitored continuously. The                        Colonoscope was introduced through the anus and                        advanced to the the cecum, identified by appendiceal                        orifice and ileocecal valve. The colonoscopy was                        performed without difficulty. The patient tolerated the                        procedure well. The quality of the bowel preparation                        was excellent. Findings:      External and internal hemorrhoids were found during endoscopy. The       hemorrhoids were small and Grade I (internal hemorrhoids that do not       prolapse).      The exam was otherwise without abnormality. Impression:           - External and internal hemorrhoids.                       -  The examination was otherwise normal.                       - No specimens collected. Recommendation:        - Repeat colonoscopy in 10 years for screening purposes. Scot Jun, MD 05/11/2018 11:16:42 AM This report has been signed electronically. Number of Addenda: 0 Note Initiated On: 05/11/2018 10:55 AM Scope Withdrawal Time: 0 hours 7 minutes 42 seconds  Total Procedure Duration: 0 hours 11 minutes 53 seconds       [facility]

## 2018-05-11 NOTE — Transfer of Care (Signed)
Immediate Anesthesia Transfer of Care Note  Patient: Scott Burch  Procedure(s) Performed: COLONOSCOPY WITH PROPOFOL (N/A )  Patient Location: PACU and Endoscopy Unit  Anesthesia Type:General  Level of Consciousness: drowsy  Airway & Oxygen Therapy: Patient Spontanous Breathing and Patient connected to nasal cannula oxygen  Post-op Assessment: Report given to RN and Post -op Vital signs reviewed and stable  Post vital signs: Reviewed and stable  Last Vitals:  Vitals Value Taken Time  BP 142/56 05/11/2018 11:18 AM  Temp 36.1 C 05/11/2018 11:10 AM  Pulse 53 05/11/2018 11:19 AM  Resp 15 05/11/2018 11:19 AM  SpO2 98 % 05/11/2018 11:19 AM  Vitals shown include unvalidated device data.  Last Pain:  Vitals:   05/11/18 1110  TempSrc: Tympanic  PainSc:          Complications: No apparent anesthesia complications

## 2018-05-12 NOTE — Anesthesia Postprocedure Evaluation (Signed)
Anesthesia Post Note  Patient: Scott Burch  Procedure(s) Performed: COLONOSCOPY WITH PROPOFOL (N/A )  Patient location during evaluation: Endoscopy Anesthesia Type: General Level of consciousness: awake and alert Pain management: pain level controlled Vital Signs Assessment: post-procedure vital signs reviewed and stable Respiratory status: spontaneous breathing, nonlabored ventilation, respiratory function stable and patient connected to nasal cannula oxygen Cardiovascular status: blood pressure returned to baseline and stable Postop Assessment: no apparent nausea or vomiting Anesthetic complications: no     Last Vitals:  Vitals:   05/11/18 1130 05/11/18 1140  BP: 123/83 112/77  Pulse: (!) 48 (!) 48  Resp: 18 14  Temp:    SpO2: 96% 98%    Last Pain:  Vitals:   05/11/18 1110  TempSrc: Tympanic  PainSc:                  Lenard Simmer

## 2018-05-13 ENCOUNTER — Encounter: Payer: Self-pay | Admitting: Unknown Physician Specialty

## 2019-03-08 ENCOUNTER — Ambulatory Visit
Admission: RE | Admit: 2019-03-08 | Discharge: 2019-03-08 | Disposition: A | Discharge status: Home / Self Care | Payer: Self-pay | Source: Ambulatory Visit | Attending: Otolaryngology | Admitting: Otolaryngology

## 2020-01-14 ENCOUNTER — Other Ambulatory Visit: Payer: Self-pay

## 2020-01-14 ENCOUNTER — Encounter (HOSPITAL_COMMUNITY): Payer: Self-pay | Admitting: *Deleted

## 2020-01-14 ENCOUNTER — Emergency Department (HOSPITAL_COMMUNITY)
Admission: EM | Admit: 2020-01-14 | Discharge: 2020-01-14 | Disposition: A | Discharge status: Home / Self Care | Payer: BC Managed Care – PPO | Attending: Emergency Medicine | Admitting: Emergency Medicine

## 2020-01-14 ENCOUNTER — Emergency Department (HOSPITAL_COMMUNITY): Payer: BC Managed Care – PPO

## 2020-01-14 DIAGNOSIS — S62635B Displaced fracture of distal phalanx of left ring finger, initial encounter for open fracture: Secondary | ICD-10-CM | POA: Diagnosis not present

## 2020-01-14 DIAGNOSIS — Y999 Unspecified external cause status: Secondary | ICD-10-CM | POA: Diagnosis not present

## 2020-01-14 DIAGNOSIS — Y93K9 Activity, other involving animal care: Secondary | ICD-10-CM | POA: Insufficient documentation

## 2020-01-14 DIAGNOSIS — S68625A Partial traumatic transphalangeal amputation of left ring finger, initial encounter: Secondary | ICD-10-CM | POA: Diagnosis not present

## 2020-01-14 DIAGNOSIS — Z7982 Long term (current) use of aspirin: Secondary | ICD-10-CM | POA: Diagnosis not present

## 2020-01-14 DIAGNOSIS — Z79899 Other long term (current) drug therapy: Secondary | ICD-10-CM | POA: Insufficient documentation

## 2020-01-14 DIAGNOSIS — Z23 Encounter for immunization: Secondary | ICD-10-CM | POA: Insufficient documentation

## 2020-01-14 DIAGNOSIS — Y929 Unspecified place or not applicable: Secondary | ICD-10-CM | POA: Insufficient documentation

## 2020-01-14 DIAGNOSIS — I10 Essential (primary) hypertension: Secondary | ICD-10-CM | POA: Diagnosis not present

## 2020-01-14 DIAGNOSIS — W540XXA Bitten by dog, initial encounter: Secondary | ICD-10-CM | POA: Insufficient documentation

## 2020-01-14 DIAGNOSIS — S68629A Partial traumatic transphalangeal amputation of unspecified finger, initial encounter: Secondary | ICD-10-CM

## 2020-01-14 DIAGNOSIS — Z8673 Personal history of transient ischemic attack (TIA), and cerebral infarction without residual deficits: Secondary | ICD-10-CM | POA: Diagnosis not present

## 2020-01-14 DIAGNOSIS — S62639B Displaced fracture of distal phalanx of unspecified finger, initial encounter for open fracture: Secondary | ICD-10-CM

## 2020-01-14 MED ORDER — HYDROMORPHONE HCL 1 MG/ML IJ SOLN
1.0000 mg | Freq: Once | INTRAMUSCULAR | Status: AC
  Administered 2020-01-14: 16:00:00 1 mg via INTRAVENOUS
  Filled 2020-01-14: qty 1

## 2020-01-14 MED ORDER — CEFAZOLIN SODIUM-DEXTROSE 2-4 GM/100ML-% IV SOLN
2.0000 g | Freq: Once | INTRAVENOUS | Status: AC
  Administered 2020-01-14: 18:00:00 2 g via INTRAVENOUS
  Filled 2020-01-14: qty 100

## 2020-01-14 MED ORDER — AMOXICILLIN-POT CLAVULANATE 875-125 MG PO TABS: 1.0000 | ORAL_TABLET | Freq: Once | ORAL | Status: DC

## 2020-01-14 MED ORDER — LIDOCAINE HCL (PF) 1 % IJ SOLN
10.0000 mL | Freq: Once | INTRAMUSCULAR | Status: AC
  Administered 2020-01-14: 10 mL
  Filled 2020-01-14: qty 10

## 2020-01-14 MED ORDER — AMOXICILLIN-POT CLAVULANATE 875-125 MG PO TABS: 1.0000 | ORAL_TABLET | Freq: Two times a day (BID) | ORAL | 0 refills | Status: AC

## 2020-01-14 MED ORDER — LIDOCAINE HCL (PF) 1 % IJ SOLN
10.0000 mL | Freq: Once | INTRAMUSCULAR | Status: AC
  Administered 2020-01-14: 18:00:00 10 mL
  Filled 2020-01-14: qty 10

## 2020-01-14 MED ORDER — TETANUS-DIPHTH-ACELL PERTUSSIS 5-2.5-18.5 LF-MCG/0.5 IM SUSP
0.5000 mL | Freq: Once | INTRAMUSCULAR | Status: AC
  Administered 2020-01-14: 18:00:00 0.5 mL via INTRAMUSCULAR
  Filled 2020-01-14: qty 0.5

## 2020-01-14 MED ORDER — CEFTRIAXONE SODIUM 1 G IJ SOLR
1.0000 g | Freq: Once | INTRAMUSCULAR | Status: DC
  Filled 2020-01-14: qty 10

## 2020-01-14 MED ORDER — HYDROMORPHONE HCL 1 MG/ML IJ SOLN
1.0000 mg | Freq: Once | INTRAMUSCULAR | Status: AC
  Administered 2020-01-14: 18:00:00 1 mg via INTRAVENOUS
  Filled 2020-01-14: qty 1

## 2020-01-14 NOTE — Discharge Instructions (Signed)
Please pick up antibiotics and take as prescribed. It is very important to finish the entire course.   Continue wearing your splint and keep the finger clean and dry until you can follow up with hand surgery. Please call [facility] to schedule an appointment with Dr. Amanda Pea.   The sutures will need to be removed in 5-7 days time. Your nail might fall off due to the trauma but will grow back. There is a chance that the distal aspect of the finger could become necrotic and fall off.   You can take 800 mg Ibuprofen every 8 hours and Tylenol 1,000 mg every 8 hours as needed for pain.   Please rest, ice, and elevate your hand to reduce swelling.   Return to the ED for any worsening symptoms including worsening pain, swelling around the wound, redness around the wound, drainage of pus, fevers > 100.4, red streaking into your hand and up your arm, or any other symptoms that are concerning to you.

## 2020-01-14 NOTE — ED Provider Notes (Signed)
[place] EMERGENCY DEPARTMENT Provider Note   CSN: [id number] Arrival date & time: 01/14/20  1519     History Chief Complaint  Patient presents with  . Finger Injury    Scott Burch is a 64 y.o. male with PMHx HTN, HLD, GERD, chronic pain who presents to the ED via EMS for finger injury that occurred PTA. Pt presents with partial amputation of L ring finger.  Orts he was walking his goldendoodle when to pit bulls came and tried to attack his dog.  He states that one of the bones got a hold of his dog and he tried to pry it off by putting his hand in his mouth causing the laceration to the left ring finger.  He states he called EMS who gave him 100 mcg of fentanyl in route per EMS animal control was able to find the animals owner and reports that it is up-to-date on all his vaccines including rabies.  States his tetanus is not up-to-date.  He is complaining of pain to the area.  He denies any other wounds at this time.  Patient is right-handed.  Not anticoagulated.   The history is provided by the patient. No language interpreter was used.       Past Medical History:  Diagnosis Date  . Anxiety   . BPH (benign prostatic hypertrophy)   . Chronic leg pain   . Chronic neck pain   . DDD (degenerative disc disease), cervical   . Degenerative disc disease, cervical   . Depression   . Dysphagia   . Frequency of urination   . GERD (gastroesophageal reflux disease)   . Headache   . Heart murmur   . History of atrial septal defect repair    CARDIOLOGIST --  DR Michaelle Birks  AT [facility] ([place] 11/2013)  . History of atrial septal defect repair   . History of kidney stones   . Hyperlipidemia   . Hypertension   . LBBB (left bundle branch block)   . Left ureteral calculus   . Partial anomalous pulmonary venous return    h/o  . TIA (transient ischemic attack)   . Urgency of urination     Patient Active Problem List   Diagnosis Date Noted  . Chronic neck pain 07/14/2016    . Renal colic on left side 28/41/3244    Past Surgical History:  Procedure Laterality Date  . ASD REPAIR  2000   [facility]  . BACK SURGERY    . CERVICAL FUSION  X2   C3 -- C4  &  C4 -- C5  . CERVICAL LAMINECTOMY  07/2013   C6 -- 7  . COLONOSCOPY    . COLONOSCOPY    . COLONOSCOPY WITH PROPOFOL N/A 05/11/2018   Procedure: COLONOSCOPY WITH PROPOFOL;  Surgeon: Manya Silvas, MD;  Location: [facility] ENDOSCOPY;  Service: Endoscopy;  Laterality: N/A;  . CORONARY ARTERY BYPASS GRAFT    . CYSTO/  LEFT RETROGRADE PYELOGRAM/  LEFT URETER DILATION/  STENT PLACEMENT  06-21-2000  . CYSTO/  LEFT URETEROSCOPY/  STENT PLACEMENT  01-15-2006  . CYSTOSCOPY WITH RETROGRADE PYELOGRAM, URETEROSCOPY AND STENT PLACEMENT Left 04/06/2014   Procedure: CYSTOSCOPY WITH LEFT RETROGRADE PYELOGRAM, LEFT URETEROSCOPY, STONE EXTRACTION , DOUBLE J STENT PLACEMENT;  Surgeon: Ailene Rud, MD;  Location: [facility];  Service: Urology;  Laterality: Left;  . CYSTOSCOPY WITH RETROGRADE PYELOGRAM, URETEROSCOPY AND STENT PLACEMENT Left 04/10/2014   Procedure: CYSTOSCOPY WITH RETROGRADE PYELOGRAM, URETEROSCOPY AND DOUBLE J  STENT PLACEMENT;  Surgeon: Kathi Ludwig, MD;  Location: [facility];  Service: Urology;  Laterality: Left;  . CYSTOSCOPY WITH RETROGRADE PYELOGRAM, URETEROSCOPY AND STENT PLACEMENT Left 04/25/2014   Procedure: CYSTOSCOPY WITH RETROGRADE PYELOGRAM, URETEROSCOPY AND STENT REPLACEMENT. ENDOPYELOTOMY;  Surgeon: Sebastian Ache, MD;  Location: [facility];  Service: Urology;  Laterality: Left;  . EXTRACORPOREAL SHOCK WAVE LITHOTRIPSY  x7  last one 2010  . HOLMIUM LASER APPLICATION Left 04/06/2014   Procedure: HOLMIUM LASER APPLICATION;  Surgeon: Kathi Ludwig, MD;  Location: [facility];  Service: Urology;  Laterality: Left;  . HOLMIUM LASER APPLICATION Left 04/25/2014   Procedure: HOLMIUM LASER APPLICATION;  Surgeon: Sebastian Ache, MD;  Location: [facility];  Service: Urology;  Laterality: Left;  . SLEEP STUDY  2005 (APPROX)   NEGATIVE       No family history on file.  Social History   Tobacco Use  . Smoking status: Never Smoker  . Smokeless tobacco: Never Used  Substance Use Topics  . Alcohol use: Yes    Alcohol/week: 5.0 standard drinks    Types: 2 Cans of beer, 3 Shots of liquor per week    Comment: weekend  . Drug use: No    Home Medications Prior to Admission medications   Medication Sig Start Date End Date Taking? Authorizing Provider  alfuzosin (UROXATRAL) 10 MG 24 hr tablet Take 10 mg by mouth daily with breakfast.    [provider]  ALPRAZolam (XANAX) 0.5 MG tablet Take 1-2 30 minutes before MRI. May take an additional one as needed. Do not drive. Patient not taking: Reported on 05/11/2018 07/14/16   Anson Fret, MD  amoxicillin-clavulanate (AUGMENTIN) 875-125 MG tablet Take 1 tablet by mouth every 12 (twelve) hours for 10 days. 01/14/20 01/24/20  Tanda Rockers, PA-C  aspirin EC 81 MG tablet Take 81 mg by mouth daily.    [provider]  citalopram (CELEXA) 40 MG tablet Take 40 mg by mouth daily.    [provider]  Clobetasol Propionate (CLOBEX) 0.05 % lotion Apply topically 2 (two) times daily.    [provider]  COCONUT OIL PO Take 1 capsule by mouth daily.    [provider]  cyanocobalamin 1000 MCG tablet Take 1,000 mcg by mouth daily.    [provider]  ergocalciferol (VITAMIN D2) 50000 units capsule Take 50,000 Units by mouth once a week.    [provider]  finasteride (PROSCAR) 5 MG tablet Take 5 mg by mouth daily.    [provider]  gabapentin (NEURONTIN) 300 MG capsule Take 1 capsule (300 mg total) by mouth 3 (three) times daily. Start with one pill at night, do not drive if taking during the day. 07/14/16   Anson Fret, MD  lisinopril (PRINIVIL,ZESTRIL) 20 MG tablet Take 20 mg by mouth daily.    [provider]  losartan (COZAAR) 50 MG tablet Take 50 mg by mouth daily.    [provider]  methylPREDNISolone (MEDROL DOSEPAK) 4 MG TBPK tablet follow package directions Patient not taking: Reported on 05/11/2018 07/14/16   Anson Fret, MD  potassium citrate (UROCIT-K) 10 MEQ (1080 MG) SR tablet Take 15 mEq by mouth daily.    [provider]  pregabalin (LYRICA) 100 MG capsule Take 1 capsule (100 mg total) by mouth 2 (two) times daily. 08/19/16   Anson Fret, MD  testosterone cypionate (DEPOTESTOTERONE CYPIONATE) 100 MG/ML injection Inject 100 mg into  the muscle every 14 (fourteen) days. For IM use only    [provider]    Allergies    Contrast media [iodinated diagnostic agents], Iodine, and Shellfish allergy  Review of Systems   Review of Systems  Constitutional: Negative for chills and fever.  Musculoskeletal: Positive for arthralgias.  Skin: Positive for wound.  All other systems reviewed and are negative.   Physical Exam Updated Vital Signs BP 118/67 (BP Location: Right Arm)   Pulse 89   Temp 98.2 F (36.8 C) (Oral)   Resp 18   SpO2 97%   Physical Exam Vitals and nursing note reviewed.  Constitutional:      Appearance: He is not ill-appearing.  HENT:     Head: Normocephalic and atraumatic.  Eyes:     Conjunctiva/sclera: Conjunctivae normal.  Cardiovascular:     Rate and Rhythm: Normal rate and regular rhythm.     Pulses: Normal pulses.  Pulmonary:     Effort: Pulmonary effort is normal.     Breath sounds: Normal breath sounds. No wheezing, rhonchi or rales.  Abdominal:     Palpations: Abdomen is soft.     Tenderness: There is no abdominal tenderness. There is no guarding or rebound.  Musculoskeletal:     Cervical back: Neck supple.     Comments: See photos below. Partial amputation noted to distal aspect of L ring finger with nail involvement; the distal aspect of the finger is attached by about 1 cm. ROM intact to finger. Cap refill < 2  seconds of proximal aspect of finger. 2+ radial pulse.   Abrasions noted to left forearm as well; no obvious puncture wounds or lacerations.   Skin:    General: Skin is warm and dry.  Neurological:     Mental Status: He is alert.          ED Results / Procedures / Treatments   Labs (all labs ordered are listed, but only abnormal results are displayed) Labs Reviewed - No data to display  EKG None  Radiology DG Finger Ring Left  Result Date: 01/14/2020 CLINICAL DATA:  Patient status post dog bite to the left ring finger today. Initial encounter. EXAM: LEFT RING FINGER 2+V COMPARISON:  None. FINDINGS: There is a transverse fracture through the tuft of the left ring finger. Laceration site of the fracture is noted. Bandaging is in place. No radiopaque foreign body. IMPRESSION: Transverse fracture tuft of the left ring finger with an associated laceration. Electronically Signed   By: Drusilla Kanner M.D.   On: 01/14/2020 16:23    Procedures .Nerve Block  Date/Time: 01/14/2020 6:42 PM Performed by: Tanda Rockers, PA-C Authorized by: Tanda Rockers, PA-C   Consent:    Consent obtained:  Verbal   Consent given by:  Patient   Risks discussed:  Infection, nerve damage and pain Indications:    Indications:  Procedural anesthesia Location:    Body area:  Upper extremity   Laterality:  Left (Left 4th digit) Pre-procedure details:    Skin preparation:  Alcohol Procedure details (see MAR for exact dosages):    Block needle gauge:  25 G   Anesthetic injected:  Lidocaine 1% w/o epi   Injection procedure:  Anatomic landmarks identified   Paresthesia:  None Post-procedure details:    Outcome:  Anesthesia achieved   Patient tolerance of procedure:  Tolerated well, no immediate complications  .Marland KitchenLaceration Repair  Date/Time: 01/14/2020 6:43 PM Performed by: Tanda Rockers, PA-C Authorized by: Tanda Rockers, PA-C  Consent:    Consent obtained:  Verbal   Consent given  by:  Patient   Risks discussed:  Infection, pain, poor cosmetic result and poor wound healing Anesthesia (see MAR for exact dosages):    Anesthesia method:  Local infiltration   Local anesthetic:  Lidocaine 1% w/o epi Laceration details:    Location:  Finger   Finger location:  L ring finger   Length (cm):  2   Depth (mm):  3 Repair type:    Repair type:  Complex Pre-procedure details:    Preparation:  Patient was prepped and draped in usual sterile fashion Exploration:    Hemostasis achieved with:  Direct pressure   Wound exploration: wound explored through full range of motion and entire depth of wound probed and visualized     Wound extent: underlying fracture   Treatment:    Area cleansed with:  Betadine   Amount of cleaning:  Extensive   Irrigation solution:  Sterile saline   Irrigation volume:  1 L   Irrigation method:  Syringe Skin repair:    Repair method:  Sutures   Suture size:  4-0   Suture material:  Nylon   Suture technique:  Simple interrupted   Number of sutures:  5 Approximation:    Approximation:  Loose Post-procedure details:    Dressing:  Non-adherent dressing and splint for protection   Patient tolerance of procedure:  Tolerated well, no immediate complications   (including critical care time)       Medications Ordered in ED Medications  lidocaine (PF) (XYLOCAINE) 1 % injection 10 mL (has no administration in time range)  lidocaine (PF) (XYLOCAINE) 1 % injection 10 mL (has no administration in time range)  Tdap (BOOSTRIX) injection 0.5 mL (0.5 mLs Intramuscular Given 01/14/20 1732)  HYDROmorphone (DILAUDID) injection 1 mg (1 mg Intravenous Given 01/14/20 1559)  ceFAZolin (ANCEF) IVPB 2g/100 mL premix (2 g Intravenous New Bag/Given 01/14/20 1742)  HYDROmorphone (DILAUDID) injection 1 mg (1 mg Intravenous Given 01/14/20 1730)    ED Course  I have reviewed the triage vital signs and the nursing notes.  Pertinent labs & imaging results that were  available during my care of the patient were reviewed by me and considered in my medical decision making (see chart for details).  65 year old male who presents to the ED today after sustaining partial amputation to his left index finger while fighting off a dog that was attacking his own dog.  This dog is up-to-date on rabies.  Patient is not up-to-date on tetanus, will update this in the ED today.  He does have partial amputation, will obtain x-ray and consult hand surgery.  Antibiotics given in the ED and will prescribe Augmentin outpatient.  Xray with distal tuft fracture.  Ancef given with concern for open fracture.  Nerve block performed and laceration repaired.  A splint has been applied with nonadherent dressing.  Patient advised to follow-up with hand surgery, he is requesting another hand surgeon than the one on call currently. Pt advised he will need to have sutures removed in 5-7 days time and to wear splint until he is seen and evaluated by ortho. He is in agreement and stable for discharge home.   This note was prepared using Dragon voice recognition software and may include unintentional dictation errors due to the inherent limitations of voice recognition software.   Clinical Course as of Jan 14 1848  Wynelle Link Jan 14, 2020  1551 Patient seen by myself as well as  PA provider.  Briefly 64 year old male presented to emergency department after being bit in his left second finger by another person's dog.  Patient reports he was at the park and strangers dog lunged and bit onto his golden doodle.  He intervened and tried to pry off the other dog's mouth, and in doing so, the dog bit off most of the tip of his left 2nd finger.  He is right handed.  He denies any other bites or trauma.  Animal control has contacted the biting dog's owner who reports his rabies vaccine is up to date.  Patient will get xray, start on augmentin, we'll discuss repair options with hand surgery   [MT]  1621 Discussed  case with hand surgeon Dr. Eulah PontMurphy who recommends wound closure in the ED and to follow up outpatient on Wed at 8:30 AM   [MV]    Clinical Course User Index [MT] Trifan, Kermit BaloMatthew J, MD [MV] Tanda RockersVenter, Nahsir Venezia, PA-C   MDM Rules/Calculators/A&P                       Final Clinical Impression(s) / ED Diagnoses Final diagnoses:  Partial traumatic amputation of finger through phalanx, initial encounter  Dog bite, initial encounter  Open fracture of tuft of distal phalanx of finger    Rx / DC Orders ED Discharge Orders         Ordered    amoxicillin-clavulanate (AUGMENTIN) 875-125 MG tablet  Every 12 hours     01/14/20 1846           Discharge Instructions     Please pick up antibiotics and take as prescribed. It is very important to finish the entire course.   Continue wearing your splint and keep the finger clean and dry until you can follow up with hand surgery. Please call [facility] to schedule an appointment with Dr. Amanda PeaGramig.   The sutures will need to be removed in 5-7 days time. Your nail might fall off due to the trauma but will grow back. There is a chance that the distal aspect of the finger could become necrotic and fall off.   You can take 800 mg Ibuprofen every 8 hours and Tylenol 1,000 mg every 8 hours as needed for pain.   Please rest, ice, and elevate your hand to reduce swelling.   Return to the ED for any worsening symptoms including worsening pain, swelling around the wound, redness around the wound, drainage of pus, fevers > 100.4, red streaking into your hand and up your arm, or any other symptoms that are concerning to you.        Tanda RockersVenter, Kayti Poss, PA-C 01/14/20 1851    Terald Sleeperrifan, Matthew J, MD 01/14/20 2102

## 2020-01-14 NOTE — ED Triage Notes (Signed)
Pt arrived by EMS with complaints of partial amputation of the left ring finger. Pt attempted to break up a dog fight and was bit. Bleeding is controlled at this time.   100 mcg of fentanyl given by EMS 18 g rt ac  142/90 HR 92  97& RA RR 18  Pt alert and oriented X4

## 2020-01-14 NOTE — ED Notes (Signed)
Pt verbalized understanding of d/c instructions, abx and followup

## 2021-11-26 ENCOUNTER — Other Ambulatory Visit: Payer: Self-pay

## 2021-11-26 ENCOUNTER — Ambulatory Visit
Admission: RE | Admit: 2021-11-26 | Discharge: 2021-11-26 | Disposition: A | Discharge status: Home / Self Care | Payer: BC Managed Care – PPO | Source: Ambulatory Visit | Attending: Physical Medicine and Rehabilitation | Admitting: Physical Medicine and Rehabilitation

## 2021-11-26 ENCOUNTER — Other Ambulatory Visit: Payer: Self-pay | Admitting: Physical Medicine and Rehabilitation

## 2021-11-26 DIAGNOSIS — M542 Cervicalgia: Secondary | ICD-10-CM

## 2021-12-22 IMAGING — CR DG CERVICAL SPINE COMPLETE 4+V
5 series · 5 of 5 positions shown · non-contrast
Comparison: 06/17/2004

CLINICAL DATA: Neck pain, right radicular pain

EXAM:
CERVICAL SPINE - COMPLETE 4+ VIEW

[w cervical spine lat]
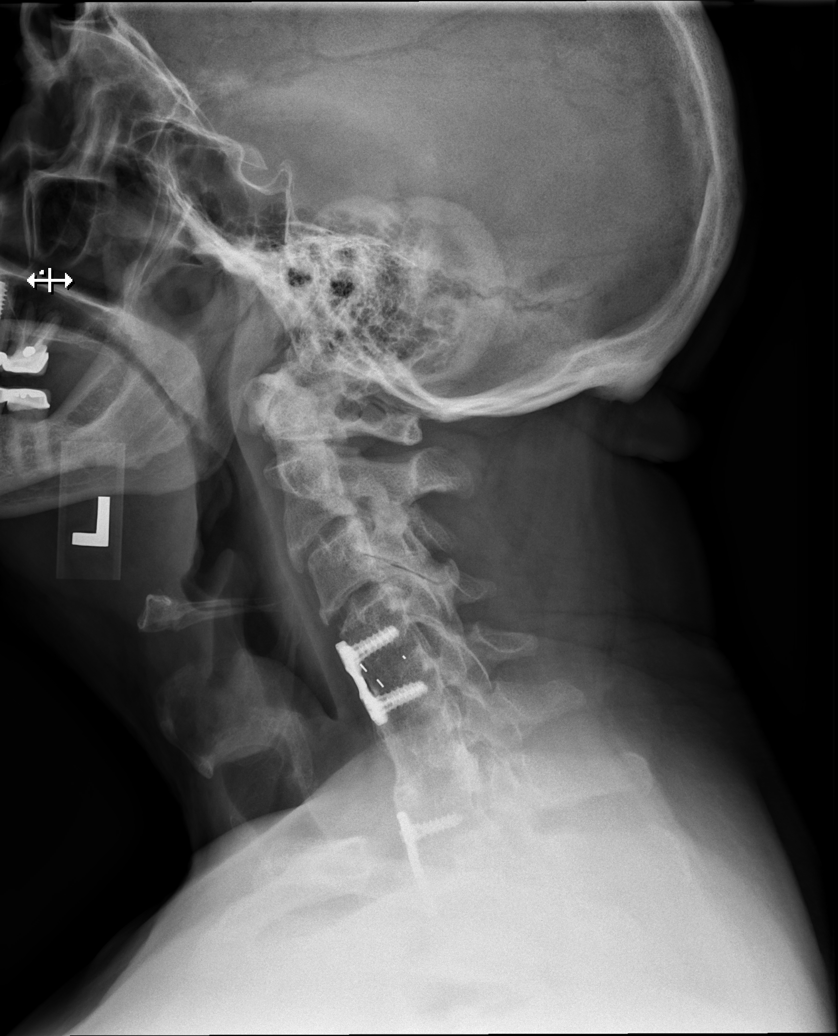

[w cervical spine ap_obl (1 of 2)]
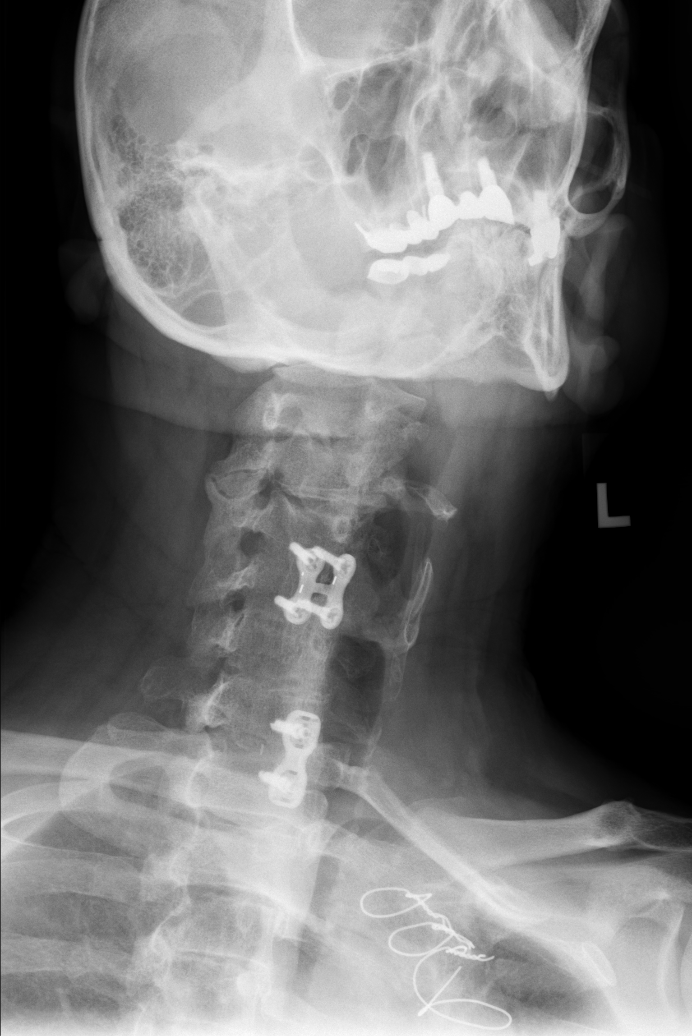

[w cervical spine ap_obl (2 of 2)]
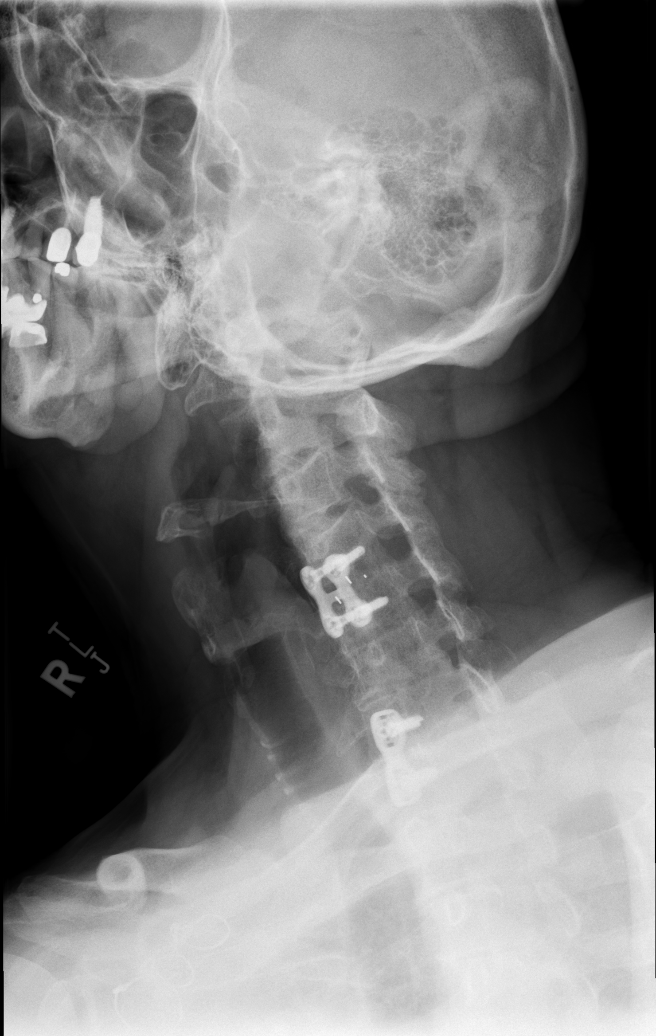

[w cervical spine ap]
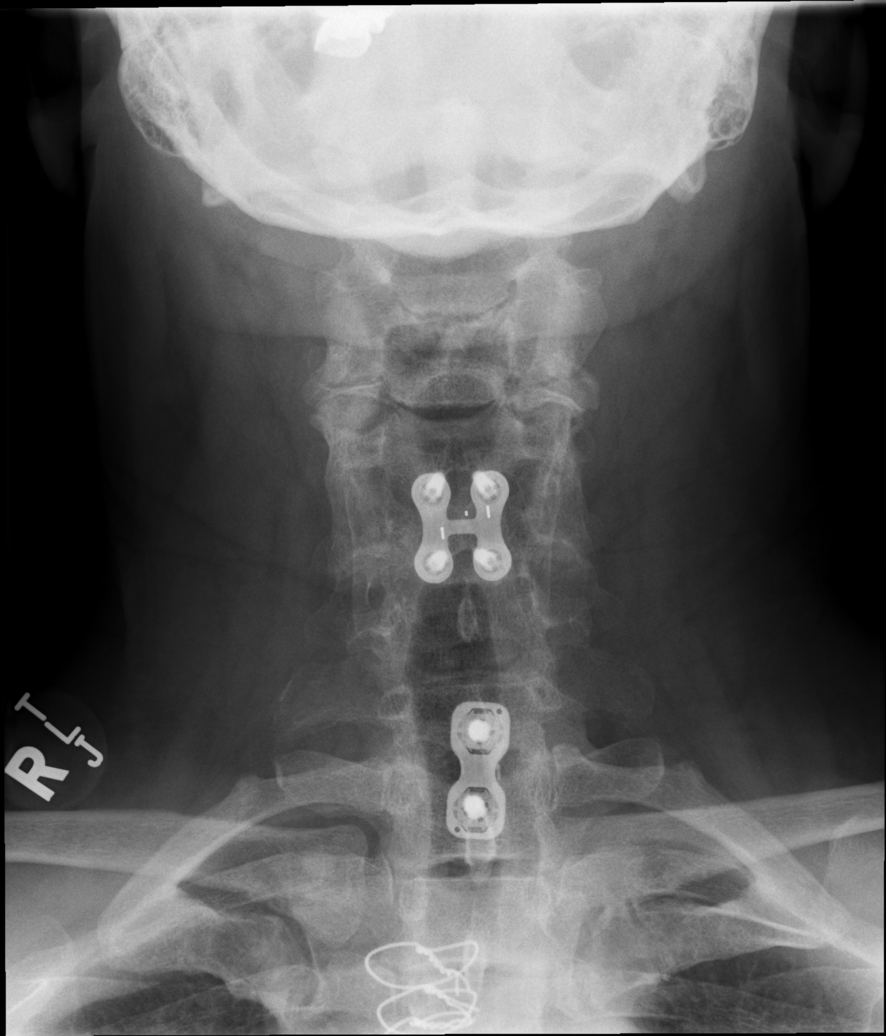

[w cervical spine odontoid]
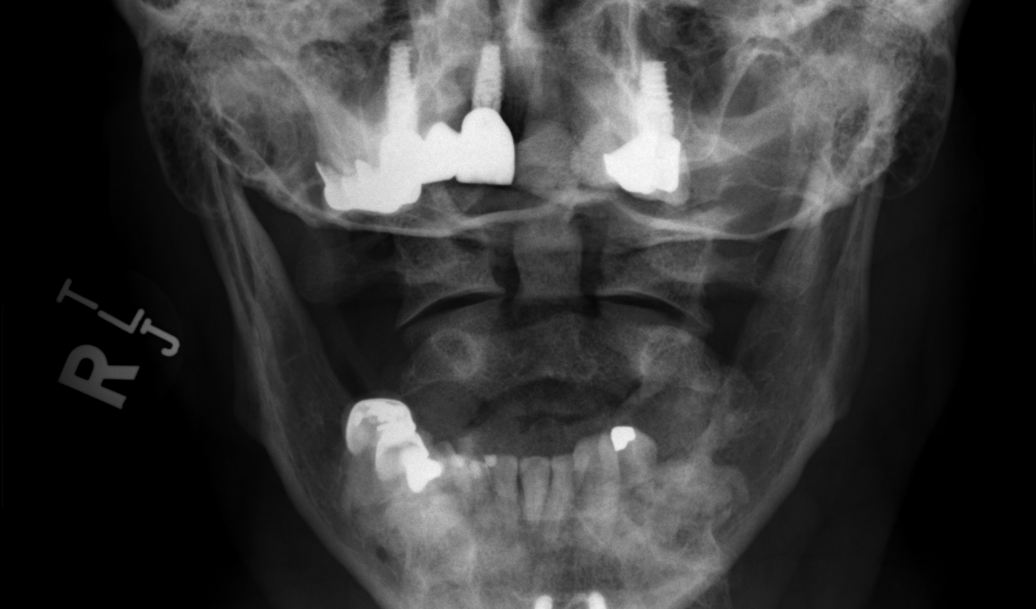

[5 of 5 positions shown; findings below may reference images not displayed]

FINDINGS: There is interval anterior surgical fusion from C4-C7 levels.
Surgical hardware is not evident at C5-C6 level. Oblique views are
less than optimal limiting evaluation of neural foramina, especially
on the right side. There is interval appearance of anterior bony
spurs at C2-C3 and C3-C4 levels. Metallic sutures are seen in the
sternum.
IMPRESSION: There is previous anterior surgical fusion from C4-C7 levels. No
recent fracture is seen. There is interval progression of
degenerative changes at C2-C3 and C3-C4 levels.

## 2022-08-27 ENCOUNTER — Other Ambulatory Visit: Payer: Self-pay | Admitting: Otolaryngology

## 2022-08-27 DIAGNOSIS — K1120 Sialoadenitis, unspecified: Secondary | ICD-10-CM

## 2022-09-24 ENCOUNTER — Ambulatory Visit
Admission: RE | Admit: 2022-09-24 | Discharge: 2022-09-24 | Disposition: A | Discharge status: Home / Self Care | Payer: BC Managed Care – PPO | Source: Ambulatory Visit | Attending: Otolaryngology | Admitting: Otolaryngology

## 2022-09-24 DIAGNOSIS — K1120 Sialoadenitis, unspecified: Secondary | ICD-10-CM

## 2022-10-01 ENCOUNTER — Other Ambulatory Visit (HOSPITAL_COMMUNITY): Payer: Self-pay

## 2022-10-01 MED ORDER — HYDROCODONE-ACETAMINOPHEN 10-325 MG PO TABS
1.0000 | ORAL_TABLET | Freq: Four times a day (QID) | ORAL | 0 refills | Status: AC | PRN
  Filled 2022-10-01: qty 20, 5d supply, fill #0

## 2022-10-02 ENCOUNTER — Other Ambulatory Visit (HOSPITAL_COMMUNITY): Payer: Self-pay

## 2022-10-02 MED ORDER — HYDROCODONE-ACETAMINOPHEN 10-325 MG PO TABS
1.0000 | ORAL_TABLET | Freq: Four times a day (QID) | ORAL | 0 refills | Status: DC | PRN
  Filled 2022-10-02: qty 100, 25d supply, fill #0

## 2022-10-05 ENCOUNTER — Other Ambulatory Visit (HOSPITAL_COMMUNITY): Payer: Self-pay

## 2022-12-24 ENCOUNTER — Other Ambulatory Visit (HOSPITAL_COMMUNITY): Payer: Self-pay

## 2022-12-24 MED ORDER — HYDROCODONE-ACETAMINOPHEN 7.5-325 MG PO TABS
ORAL_TABLET | ORAL | 0 refills | Status: AC
  Filled 2022-12-24 (×2): qty 150, 30d supply, fill #0

## 2022-12-24 MED ORDER — HYDROCODONE-ACETAMINOPHEN 10-325 MG PO TABS
1.0000 | ORAL_TABLET | Freq: Four times a day (QID) | ORAL | 0 refills | Status: AC | PRN
  Filled 2022-12-24: qty 120, 30d supply, fill #0

## 2023-01-21 ENCOUNTER — Other Ambulatory Visit (HOSPITAL_COMMUNITY): Payer: Self-pay

## 2023-01-21 MED ORDER — HYDROCODONE-ACETAMINOPHEN 7.5-325 MG PO TABS
1.0000 | ORAL_TABLET | Freq: Every day | ORAL | 0 refills | Status: DC | PRN
  Filled 2023-01-21: qty 150, 30d supply, fill #0

## 2023-02-18 ENCOUNTER — Other Ambulatory Visit: Payer: Self-pay

## 2023-02-18 ENCOUNTER — Other Ambulatory Visit (HOSPITAL_COMMUNITY): Payer: Self-pay

## 2023-02-18 MED ORDER — PREGABALIN 75 MG PO CAPS
75.0000 mg | ORAL_CAPSULE | Freq: Two times a day (BID) | ORAL | 2 refills | Status: AC
  Filled 2023-02-18: qty 60, 30d supply, fill #0

## 2023-02-18 MED ORDER — HYDROCODONE-ACETAMINOPHEN 7.5-325 MG PO TABS
1.0000 | ORAL_TABLET | Freq: Every day | ORAL | 0 refills | Status: DC | PRN
  Filled 2023-02-18: qty 150, 30d supply, fill #0

## 2023-03-16 ENCOUNTER — Other Ambulatory Visit (HOSPITAL_COMMUNITY): Payer: Self-pay

## 2023-03-16 MED ORDER — BACLOFEN 10 MG PO TABS
5.0000 mg | ORAL_TABLET | Freq: Three times a day (TID) | ORAL | 2 refills | Status: AC | PRN
  Filled 2023-03-16: qty 45, 30d supply, fill #0

## 2023-03-16 MED ORDER — HYDROCODONE-ACETAMINOPHEN 7.5-325 MG PO TABS
1.0000 | ORAL_TABLET | Freq: Every day | ORAL | 0 refills | Status: DC | PRN
  Filled 2023-03-16: qty 150, 32d supply, fill #0
  Filled 2023-03-16 – 2023-03-17 (×3): qty 150, 30d supply, fill #0

## 2023-03-17 ENCOUNTER — Other Ambulatory Visit (HOSPITAL_COMMUNITY): Payer: Self-pay

## 2023-04-13 ENCOUNTER — Other Ambulatory Visit (HOSPITAL_COMMUNITY): Payer: Self-pay

## 2023-04-13 MED ORDER — HYDROCODONE-ACETAMINOPHEN 7.5-325 MG PO TABS
1.0000 | ORAL_TABLET | Freq: Every day | ORAL | 0 refills | Status: AC | PRN
  Filled 2023-04-13 – 2023-04-14 (×2): qty 150, 30d supply, fill #0

## 2023-04-14 ENCOUNTER — Other Ambulatory Visit (HOSPITAL_COMMUNITY): Payer: Self-pay

## 2023-05-11 ENCOUNTER — Other Ambulatory Visit (HOSPITAL_COMMUNITY): Payer: Self-pay

## 2023-05-11 MED ORDER — PREGABALIN 75 MG PO CAPS
75.0000 mg | ORAL_CAPSULE | Freq: Two times a day (BID) | ORAL | 2 refills | Status: AC
  Filled 2023-05-11: qty 60, 30d supply, fill #0

## 2023-05-11 MED ORDER — HYDROCODONE-ACETAMINOPHEN 7.5-325 MG PO TABS
ORAL_TABLET | ORAL | 0 refills | Status: DC
  Filled 2023-05-12: qty 150, 30d supply, fill #0

## 2023-05-12 ENCOUNTER — Other Ambulatory Visit (HOSPITAL_COMMUNITY): Payer: Self-pay

## 2023-06-08 ENCOUNTER — Other Ambulatory Visit (HOSPITAL_COMMUNITY): Payer: Self-pay

## 2023-06-08 MED ORDER — HYDROCODONE-ACETAMINOPHEN 7.5-325 MG PO TABS
1.0000 | ORAL_TABLET | Freq: Every day | ORAL | 0 refills | Status: AC | PRN
  Filled 2023-06-09: qty 150, 30d supply, fill #0

## 2023-06-08 MED ORDER — HYDROCODONE-ACETAMINOPHEN 7.5-325 MG PO TABS
1.0000 | ORAL_TABLET | Freq: Every day | ORAL | 0 refills | Status: AC | PRN
  Filled 2023-07-07: qty 140, 30d supply, fill #1
  Filled 2023-07-07: qty 140, 30d supply, fill #0
  Filled 2023-07-07 (×2): qty 150, 30d supply, fill #0
  Filled 2023-07-07: qty 140, 29d supply, fill #1
  Filled 2023-07-07: qty 10, 2d supply, fill #0

## 2023-06-08 MED ORDER — BACLOFEN 10 MG PO TABS
5.0000 mg | ORAL_TABLET | Freq: Three times a day (TID) | ORAL | 2 refills | Status: AC | PRN
  Filled 2023-06-08: qty 45, 30d supply, fill #0

## 2023-06-09 ENCOUNTER — Other Ambulatory Visit (HOSPITAL_COMMUNITY): Payer: Self-pay

## 2023-07-06 ENCOUNTER — Other Ambulatory Visit (HOSPITAL_COMMUNITY): Payer: Self-pay

## 2023-07-07 ENCOUNTER — Other Ambulatory Visit (HOSPITAL_COMMUNITY): Payer: Self-pay

## 2023-07-07 ENCOUNTER — Other Ambulatory Visit: Payer: Self-pay

## 2023-07-12 ENCOUNTER — Other Ambulatory Visit (HOSPITAL_COMMUNITY): Payer: Self-pay

## 2023-07-13 ENCOUNTER — Other Ambulatory Visit (HOSPITAL_COMMUNITY): Payer: Self-pay

## 2023-08-04 ENCOUNTER — Other Ambulatory Visit (HOSPITAL_COMMUNITY): Payer: Self-pay

## 2023-08-04 MED ORDER — PREGABALIN 75 MG PO CAPS
75.0000 mg | ORAL_CAPSULE | Freq: Two times a day (BID) | ORAL | 2 refills | Status: AC
  Filled 2023-08-04: qty 60, 30d supply, fill #0

## 2023-08-04 MED ORDER — HYDROCODONE-ACETAMINOPHEN 7.5-325 MG PO TABS
1.0000 | ORAL_TABLET | Freq: Every day | ORAL | 0 refills | Status: AC | PRN
  Filled 2023-08-04: qty 150, 30d supply, fill #0

## 2023-08-05 ENCOUNTER — Other Ambulatory Visit (HOSPITAL_COMMUNITY): Payer: Self-pay

## 2023-09-01 ENCOUNTER — Other Ambulatory Visit (HOSPITAL_COMMUNITY): Payer: Self-pay

## 2023-09-01 MED ORDER — HYDROCODONE-ACETAMINOPHEN 7.5-325 MG PO TABS
1.0000 | ORAL_TABLET | Freq: Every day | ORAL | 0 refills | Status: AC | PRN
  Filled 2023-09-01: qty 150, 30d supply, fill #0

## 2023-09-01 MED ORDER — BACLOFEN 10 MG PO TABS
5.0000 mg | ORAL_TABLET | Freq: Three times a day (TID) | ORAL | 2 refills | Status: AC | PRN
  Filled 2023-09-01: qty 45, 30d supply, fill #0

## 2023-09-29 ENCOUNTER — Other Ambulatory Visit (HOSPITAL_COMMUNITY): Payer: Self-pay

## 2023-09-29 MED ORDER — HYDROCODONE-ACETAMINOPHEN 7.5-325 MG PO TABS
1.0000 | ORAL_TABLET | Freq: Every day | ORAL | 0 refills | Status: AC | PRN
  Filled 2023-09-29: qty 150, 30d supply, fill #0

## 2023-10-27 ENCOUNTER — Other Ambulatory Visit (HOSPITAL_COMMUNITY): Payer: Self-pay

## 2023-10-27 MED ORDER — PREGABALIN 75 MG PO CAPS
75.0000 mg | ORAL_CAPSULE | Freq: Two times a day (BID) | ORAL | 2 refills | Status: AC
  Filled 2023-10-27: qty 60, 30d supply, fill #0

## 2023-10-27 MED ORDER — HYDROCODONE-ACETAMINOPHEN 7.5-325 MG PO TABS
1.0000 | ORAL_TABLET | Freq: Every day | ORAL | 0 refills | Status: AC | PRN
  Filled 2023-10-27: qty 150, 30d supply, fill #0

## 2023-11-24 ENCOUNTER — Other Ambulatory Visit: Payer: Self-pay

## 2023-11-24 ENCOUNTER — Other Ambulatory Visit (HOSPITAL_COMMUNITY): Payer: Self-pay

## 2023-11-24 MED ORDER — HYDROCODONE-ACETAMINOPHEN 7.5-325 MG PO TABS
1.0000 | ORAL_TABLET | Freq: Every day | ORAL | 0 refills | Status: DC | PRN
  Filled 2023-11-24: qty 150, 32d supply, fill #0
  Filled 2023-11-24: qty 150, 30d supply, fill #0

## 2023-11-24 MED ORDER — BACLOFEN 10 MG PO TABS
5.0000 mg | ORAL_TABLET | Freq: Three times a day (TID) | ORAL | 2 refills | Status: AC | PRN
  Filled 2023-11-24: qty 45, 30d supply, fill #0

## 2023-12-24 ENCOUNTER — Other Ambulatory Visit (HOSPITAL_COMMUNITY): Payer: Self-pay

## 2023-12-24 MED ORDER — HYDROCODONE-ACETAMINOPHEN 7.5-325 MG PO TABS
1.0000 | ORAL_TABLET | Freq: Every day | ORAL | 0 refills | Status: DC | PRN
  Filled 2023-12-24: qty 150, 30d supply, fill #0

## 2023-12-27 ENCOUNTER — Other Ambulatory Visit (HOSPITAL_COMMUNITY): Payer: Self-pay

## 2024-01-25 ENCOUNTER — Other Ambulatory Visit (HOSPITAL_COMMUNITY): Payer: Self-pay

## 2024-01-25 MED ORDER — PREGABALIN 75 MG PO CAPS
75.0000 mg | ORAL_CAPSULE | Freq: Two times a day (BID) | ORAL | 2 refills | Status: AC
  Filled 2024-01-25: qty 60, 30d supply, fill #0

## 2024-01-25 MED ORDER — HYDROCODONE-ACETAMINOPHEN 7.5-325 MG PO TABS
1.0000 | ORAL_TABLET | Freq: Every day | ORAL | 0 refills | Status: DC | PRN
  Filled 2024-01-25: qty 150, 30d supply, fill #0

## 2024-02-22 ENCOUNTER — Other Ambulatory Visit (HOSPITAL_COMMUNITY): Payer: Self-pay

## 2024-02-22 MED ORDER — HYDROCODONE-ACETAMINOPHEN 7.5-325 MG PO TABS
1.0000 | ORAL_TABLET | Freq: Every day | ORAL | 0 refills | Status: AC | PRN
  Filled 2024-02-22: qty 150, 30d supply, fill #0

## 2024-02-22 MED ORDER — BACLOFEN 10 MG PO TABS
10.0000 mg | ORAL_TABLET | Freq: Three times a day (TID) | ORAL | 2 refills | Status: AC | PRN
  Filled 2024-02-22: qty 45, 15d supply, fill #0

## 2024-03-16 ENCOUNTER — Other Ambulatory Visit (HOSPITAL_COMMUNITY): Payer: Self-pay

## 2024-03-22 ENCOUNTER — Other Ambulatory Visit (HOSPITAL_COMMUNITY): Payer: Self-pay

## 2024-03-22 MED ORDER — HYDROCODONE-ACETAMINOPHEN 7.5-325 MG PO TABS
ORAL_TABLET | ORAL | 0 refills | Status: DC
  Filled 2024-03-22: qty 150, 30d supply, fill #0

## 2024-04-19 ENCOUNTER — Other Ambulatory Visit (HOSPITAL_COMMUNITY): Payer: Self-pay

## 2024-04-19 MED ORDER — HYDROCODONE-ACETAMINOPHEN 7.5-325 MG PO TABS
1.0000 | ORAL_TABLET | Freq: Every day | ORAL | 0 refills | Status: DC
  Filled 2024-04-19: qty 142, 29d supply, fill #0
  Filled 2024-04-19: qty 8, 1d supply, fill #0

## 2024-05-17 ENCOUNTER — Other Ambulatory Visit (HOSPITAL_COMMUNITY): Payer: Self-pay

## 2024-05-17 MED ORDER — BACLOFEN 10 MG PO TABS
5.0000 mg | ORAL_TABLET | Freq: Three times a day (TID) | ORAL | 2 refills | Status: AC | PRN
  Filled 2024-05-17: qty 45, 30d supply, fill #0

## 2024-05-17 MED ORDER — PREGABALIN 75 MG PO CAPS
75.0000 mg | ORAL_CAPSULE | Freq: Two times a day (BID) | ORAL | 2 refills | Status: AC
  Filled 2024-05-17: qty 60, 30d supply, fill #0

## 2024-05-17 MED ORDER — HYDROCODONE-ACETAMINOPHEN 7.5-325 MG PO TABS
1.0000 | ORAL_TABLET | ORAL | 0 refills | Status: AC
  Filled 2024-05-17: qty 150, 30d supply, fill #0

## 2024-05-25 ENCOUNTER — Other Ambulatory Visit (HOSPITAL_COMMUNITY): Payer: Self-pay

## 2024-06-07 ENCOUNTER — Other Ambulatory Visit (HOSPITAL_COMMUNITY): Payer: Self-pay

## 2024-06-13 ENCOUNTER — Other Ambulatory Visit (HOSPITAL_COMMUNITY): Payer: Self-pay

## 2024-06-13 MED ORDER — HYDROCODONE-ACETAMINOPHEN 7.5-325 MG PO TABS
1.0000 | ORAL_TABLET | Freq: Every day | ORAL | 0 refills | Status: DC | PRN
  Filled 2024-06-13 – 2024-06-14 (×2): qty 150, 30d supply, fill #0

## 2024-06-14 ENCOUNTER — Other Ambulatory Visit (HOSPITAL_COMMUNITY): Payer: Self-pay

## 2024-06-26 ENCOUNTER — Other Ambulatory Visit (HOSPITAL_COMMUNITY): Payer: Self-pay

## 2024-06-28 ENCOUNTER — Other Ambulatory Visit (HOSPITAL_COMMUNITY): Payer: Self-pay

## 2024-07-12 ENCOUNTER — Other Ambulatory Visit (HOSPITAL_COMMUNITY): Payer: Self-pay

## 2024-07-12 MED ORDER — HYDROCODONE-ACETAMINOPHEN 7.5-325 MG PO TABS
1.0000 | ORAL_TABLET | Freq: Every day | ORAL | 0 refills | Status: DC | PRN
  Filled 2024-07-12: qty 150, 30d supply, fill #0

## 2024-08-09 ENCOUNTER — Other Ambulatory Visit (HOSPITAL_COMMUNITY): Payer: Self-pay

## 2024-08-09 MED ORDER — BACLOFEN 10 MG PO TABS
5.0000 mg | ORAL_TABLET | Freq: Three times a day (TID) | ORAL | 2 refills | Status: AC | PRN
  Filled 2024-08-09: qty 45, 30d supply, fill #0

## 2024-08-09 MED ORDER — HYDROCODONE-ACETAMINOPHEN 7.5-325 MG PO TABS
1.0000 | ORAL_TABLET | Freq: Every day | ORAL | 0 refills | Status: AC | PRN
  Filled 2024-08-09: qty 150, 30d supply, fill #0

## 2024-08-09 MED ORDER — PREGABALIN 75 MG PO CAPS
75.0000 mg | ORAL_CAPSULE | Freq: Two times a day (BID) | ORAL | 2 refills | Status: AC
  Filled 2024-08-09: qty 60, 30d supply, fill #0
  Filled 2024-10-12: qty 46, 23d supply, fill #1
  Filled 2024-10-12: qty 60, 30d supply, fill #1
  Filled 2024-10-12: qty 14, 7d supply, fill #1

## 2024-09-05 ENCOUNTER — Other Ambulatory Visit (HOSPITAL_COMMUNITY): Payer: Self-pay

## 2024-09-05 MED ORDER — HYDROCODONE-ACETAMINOPHEN 7.5-325 MG PO TABS
1.0000 | ORAL_TABLET | Freq: Every day | ORAL | 0 refills | Status: AC | PRN
  Filled 2024-10-04: qty 150, 30d supply, fill #0

## 2024-09-05 MED ORDER — HYDROCODONE-ACETAMINOPHEN 7.5-325 MG PO TABS
1.0000 | ORAL_TABLET | Freq: Every day | ORAL | 0 refills | Status: AC | PRN
  Filled 2024-09-06: qty 150, 30d supply, fill #0

## 2024-09-06 ENCOUNTER — Other Ambulatory Visit (HOSPITAL_COMMUNITY): Payer: Self-pay

## 2024-09-06 ENCOUNTER — Other Ambulatory Visit: Payer: Self-pay

## 2024-10-02 ENCOUNTER — Other Ambulatory Visit: Payer: Self-pay

## 2024-10-04 ENCOUNTER — Other Ambulatory Visit (HOSPITAL_COMMUNITY): Payer: Self-pay

## 2024-10-12 ENCOUNTER — Other Ambulatory Visit (HOSPITAL_COMMUNITY): Payer: Self-pay

## 2024-10-23 ENCOUNTER — Other Ambulatory Visit (HOSPITAL_COMMUNITY): Payer: Self-pay

## 2024-11-02 ENCOUNTER — Other Ambulatory Visit (HOSPITAL_COMMUNITY): Payer: Self-pay

## 2024-11-02 MED ORDER — PREGABALIN 75 MG PO CAPS
75.0000 mg | ORAL_CAPSULE | Freq: Two times a day (BID) | ORAL | 0 refills | Status: AC
  Filled 2024-11-02: qty 60, 30d supply, fill #0

## 2024-11-02 MED ORDER — BACLOFEN 10 MG PO TABS
5.0000 mg | ORAL_TABLET | Freq: Three times a day (TID) | ORAL | 2 refills | Status: AC | PRN
  Filled 2024-11-02: qty 45, 30d supply, fill #0

## 2024-11-02 MED ORDER — HYDROCODONE-ACETAMINOPHEN 7.5-325 MG PO TABS
1.0000 | ORAL_TABLET | Freq: Every day | ORAL | 0 refills | Status: AC
  Filled 2024-11-02: qty 150, 30d supply, fill #0

## 2024-11-07 ENCOUNTER — Other Ambulatory Visit: Payer: Self-pay

## 2024-11-30 ENCOUNTER — Other Ambulatory Visit (HOSPITAL_COMMUNITY): Payer: Self-pay

## 2024-11-30 MED ORDER — HYDROCODONE-ACETAMINOPHEN 7.5-325 MG PO TABS
1.0000 | ORAL_TABLET | Freq: Every day | ORAL | 0 refills | Status: DC | PRN
  Filled 2024-11-30: qty 150, 30d supply, fill #0

## 2024-12-11 ENCOUNTER — Other Ambulatory Visit (HOSPITAL_COMMUNITY): Payer: Self-pay

## 2024-12-11 MED ORDER — VALACYCLOVIR HCL 1 G PO TABS
ORAL_TABLET | ORAL | 0 refills | Status: AC
  Filled 2024-12-11: qty 8, 5d supply, fill #0

## 2024-12-11 MED ORDER — DOXYCYCLINE HYCLATE 100 MG PO CAPS
ORAL_CAPSULE | ORAL | 0 refills | Status: AC
  Filled 2024-12-11: qty 42, 21d supply, fill #0

## 2024-12-11 MED ORDER — MUPIROCIN 2 % EX OINT
TOPICAL_OINTMENT | CUTANEOUS | 1 refills | Status: AC
  Filled 2024-12-11: qty 22, 30d supply, fill #0

## 2024-12-27 ENCOUNTER — Other Ambulatory Visit (HOSPITAL_COMMUNITY): Payer: Self-pay

## 2024-12-27 MED ORDER — HYDROCODONE-ACETAMINOPHEN 7.5-325 MG PO TABS
1.0000 | ORAL_TABLET | Freq: Every day | ORAL | 0 refills | Status: DC | PRN
  Filled 2024-12-27: qty 105, 21d supply, fill #0

## 2025-01-18 ENCOUNTER — Other Ambulatory Visit: Payer: Self-pay

## 2025-01-18 ENCOUNTER — Other Ambulatory Visit (HOSPITAL_COMMUNITY): Payer: Self-pay

## 2025-01-18 MED ORDER — PREGABALIN 75 MG PO CAPS
75.0000 mg | ORAL_CAPSULE | Freq: Two times a day (BID) | ORAL | 2 refills | Status: AC
  Filled 2025-01-18: qty 60, 30d supply, fill #0

## 2025-01-18 MED ORDER — BACLOFEN 10 MG PO TABS
5.0000 mg | ORAL_TABLET | Freq: Three times a day (TID) | ORAL | 2 refills | Status: AC
  Filled 2025-01-18: qty 45, 30d supply, fill #0

## 2025-01-18 MED ORDER — HYDROCODONE-ACETAMINOPHEN 7.5-325 MG PO TABS
1.0000 | ORAL_TABLET | Freq: Every day | ORAL | 0 refills | Status: AC | PRN
  Filled 2025-01-18: qty 150, 30d supply, fill #0
